# Patient Record
Sex: Female | Born: 1955 | Race: White | Hispanic: No | Marital: Married | Smoking: Former smoker
Health system: Southern US, Community
[De-identification: ages and names within clinical notes are randomized; demographics above are authoritative.]

## PROBLEM LIST (undated history)

## (undated) DIAGNOSIS — C801 Malignant (primary) neoplasm, unspecified: Secondary | ICD-10-CM

## (undated) DIAGNOSIS — F329 Major depressive disorder, single episode, unspecified: Secondary | ICD-10-CM

## (undated) DIAGNOSIS — J45909 Unspecified asthma, uncomplicated: Secondary | ICD-10-CM

## (undated) DIAGNOSIS — T7840XA Allergy, unspecified, initial encounter: Secondary | ICD-10-CM

## (undated) DIAGNOSIS — E785 Hyperlipidemia, unspecified: Secondary | ICD-10-CM

## (undated) DIAGNOSIS — I10 Essential (primary) hypertension: Secondary | ICD-10-CM

## (undated) DIAGNOSIS — F32A Depression, unspecified: Secondary | ICD-10-CM

## (undated) DIAGNOSIS — M1990 Unspecified osteoarthritis, unspecified site: Secondary | ICD-10-CM

## (undated) DIAGNOSIS — E079 Disorder of thyroid, unspecified: Secondary | ICD-10-CM

## (undated) HISTORY — PX: BRAIN SURGERY: SHX531

## (undated) HISTORY — DX: Unspecified asthma, uncomplicated: J45.909

## (undated) HISTORY — DX: Depression, unspecified: F32.A

## (undated) HISTORY — DX: Hyperlipidemia, unspecified: E78.5

## (undated) HISTORY — DX: Allergy, unspecified, initial encounter: T78.40XA

## (undated) HISTORY — DX: Disorder of thyroid, unspecified: E07.9

## (undated) HISTORY — DX: Essential (primary) hypertension: I10

## (undated) HISTORY — DX: Unspecified osteoarthritis, unspecified site: M19.90

## (undated) HISTORY — DX: Malignant (primary) neoplasm, unspecified: C80.1

---
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
## 1981-06-21 HISTORY — PX: EXPLORATORY LAPAROTOMY: SUR591

## 1996-06-21 HISTORY — PX: CHOLECYSTECTOMY: SHX55

## 1999-06-02 ENCOUNTER — Other Ambulatory Visit: Admission: RE | Admit: 1999-06-02 | Discharge: 1999-06-02 | Payer: Self-pay | Admitting: Family Medicine

## 1999-08-27 ENCOUNTER — Ambulatory Visit (HOSPITAL_COMMUNITY): Admission: RE | Admit: 1999-08-27 | Discharge: 1999-08-27 | Payer: Self-pay | Admitting: Family Medicine

## 1999-08-27 ENCOUNTER — Encounter: Payer: Self-pay | Admitting: Family Medicine

## 2001-03-09 ENCOUNTER — Other Ambulatory Visit: Admission: RE | Admit: 2001-03-09 | Discharge: 2001-03-09 | Payer: Self-pay | Admitting: Family Medicine

## 2004-01-28 ENCOUNTER — Ambulatory Visit (HOSPITAL_COMMUNITY): Admission: RE | Admit: 2004-01-28 | Discharge: 2004-01-28 | Payer: Self-pay | Admitting: Family Medicine

## 2004-02-11 ENCOUNTER — Encounter: Admission: RE | Admit: 2004-02-11 | Discharge: 2004-02-11 | Payer: Self-pay | Admitting: Family Medicine

## 2005-03-04 ENCOUNTER — Encounter: Admission: RE | Admit: 2005-03-04 | Discharge: 2005-03-04 | Payer: Self-pay | Admitting: Family Medicine

## 2008-01-04 ENCOUNTER — Encounter: Admission: RE | Admit: 2008-01-04 | Discharge: 2008-01-04 | Payer: Self-pay | Admitting: Family Medicine

## 2008-11-12 ENCOUNTER — Other Ambulatory Visit: Admission: RE | Admit: 2008-11-12 | Discharge: 2008-11-12 | Payer: Self-pay | Admitting: Family Medicine

## 2008-11-12 ENCOUNTER — Ambulatory Visit: Payer: Self-pay | Admitting: Family Medicine

## 2008-11-12 ENCOUNTER — Encounter: Payer: Self-pay | Admitting: Family Medicine

## 2008-11-12 DIAGNOSIS — F4321 Adjustment disorder with depressed mood: Secondary | ICD-10-CM

## 2008-11-12 DIAGNOSIS — E039 Hypothyroidism, unspecified: Secondary | ICD-10-CM | POA: Insufficient documentation

## 2008-11-12 DIAGNOSIS — E785 Hyperlipidemia, unspecified: Secondary | ICD-10-CM

## 2008-11-14 ENCOUNTER — Ambulatory Visit: Payer: Self-pay | Admitting: Family Medicine

## 2008-11-14 LAB — CONVERTED CEMR LAB
Alkaline Phosphatase: 85 units/L (ref 39–117)
BUN: 16 mg/dL (ref 6–23)
Basophils Absolute: 0 10*3/uL (ref 0.0–0.1)
Bilirubin, Direct: 0 mg/dL (ref 0.0–0.3)
CO2: 26 meq/L (ref 19–32)
Calcium: 9.1 mg/dL (ref 8.4–10.5)
Cholesterol: 247 mg/dL — ABNORMAL HIGH (ref 0–200)
Creatinine, Ser: 0.7 mg/dL (ref 0.4–1.2)
Eosinophils Absolute: 0.1 10*3/uL (ref 0.0–0.7)
Glucose, Bld: 95 mg/dL (ref 70–99)
Hgb A1c MFr Bld: 6.1 % (ref 4.6–6.5)
Lymphocytes Relative: 41.8 % (ref 12.0–46.0)
MCHC: 34 g/dL (ref 30.0–36.0)
MCV: 83.8 fL (ref 78.0–100.0)
Monocytes Absolute: 0.3 10*3/uL (ref 0.1–1.0)
Neutrophils Relative %: 51.5 % (ref 43.0–77.0)
Platelets: 265 10*3/uL (ref 150.0–400.0)
RBC: 4.76 M/uL (ref 3.87–5.11)
RDW: 13.8 % (ref 11.5–14.6)
Total Bilirubin: 0.7 mg/dL (ref 0.3–1.2)
Total CHOL/HDL Ratio: 5
Triglycerides: 203 mg/dL — ABNORMAL HIGH (ref 0.0–149.0)

## 2008-11-19 ENCOUNTER — Ambulatory Visit: Payer: Self-pay | Admitting: Gastroenterology

## 2008-11-28 ENCOUNTER — Ambulatory Visit: Payer: Self-pay | Admitting: Gastroenterology

## 2008-11-28 ENCOUNTER — Encounter: Payer: Self-pay | Admitting: Gastroenterology

## 2008-11-29 ENCOUNTER — Encounter: Payer: Self-pay | Admitting: Gastroenterology

## 2009-01-09 ENCOUNTER — Encounter: Admission: RE | Admit: 2009-01-09 | Discharge: 2009-01-09 | Payer: Self-pay | Admitting: Family Medicine

## 2010-07-12 ENCOUNTER — Encounter: Payer: Self-pay | Admitting: Family Medicine

## 2010-11-10 ENCOUNTER — Other Ambulatory Visit: Payer: Self-pay | Admitting: Family Medicine

## 2011-10-06 ENCOUNTER — Other Ambulatory Visit: Payer: Self-pay | Admitting: Family Medicine

## 2011-10-07 ENCOUNTER — Other Ambulatory Visit: Payer: Self-pay | Admitting: Family Medicine

## 2011-11-05 ENCOUNTER — Other Ambulatory Visit: Payer: Self-pay | Admitting: Family

## 2011-11-05 MED ORDER — FLUOXETINE HCL 20 MG PO CAPS: 20.0000 mg | ORAL_CAPSULE | Freq: Every day | ORAL | Status: DC

## 2011-11-05 MED ORDER — LEVOTHYROXINE SODIUM 88 MCG PO TABS: 88.0000 ug | ORAL_TABLET | Freq: Every day | ORAL | Status: DC

## 2011-11-05 NOTE — Telephone Encounter (Signed)
Pt needs refill on synthroid 88 mcg and fluoxetine 20 mg call into [facility]

## 2011-11-09 ENCOUNTER — Other Ambulatory Visit (INDEPENDENT_AMBULATORY_CARE_PROVIDER_SITE_OTHER): Payer: 59

## 2011-11-09 DIAGNOSIS — Z0000 Encounter for general adult medical examination without abnormal findings: Secondary | ICD-10-CM

## 2011-11-09 LAB — HEPATIC FUNCTION PANEL
ALT: 20 U/L (ref 0–35)
AST: 20 U/L (ref 0–37)
Albumin: 4 g/dL (ref 3.5–5.2)
Total Bilirubin: 0.3 mg/dL (ref 0.3–1.2)

## 2011-11-09 LAB — LDL CHOLESTEROL, DIRECT: Direct LDL: 140.9 mg/dL

## 2011-11-09 LAB — POCT URINALYSIS DIPSTICK
Leukocytes, UA: NEGATIVE
Nitrite, UA: NEGATIVE
Protein, UA: NEGATIVE
Urobilinogen, UA: 0.2
pH, UA: 5.5

## 2011-11-09 LAB — CBC WITH DIFFERENTIAL/PLATELET
Basophils Absolute: 0 10*3/uL (ref 0.0–0.1)
Eosinophils Relative: 1.3 % (ref 0.0–5.0)
HCT: 39.3 % (ref 36.0–46.0)
Hemoglobin: 12.9 g/dL (ref 12.0–15.0)
Lymphs Abs: 3.1 10*3/uL (ref 0.7–4.0)
MCV: 83.5 fl (ref 78.0–100.0)
Monocytes Absolute: 0.3 10*3/uL (ref 0.1–1.0)
Monocytes Relative: 3.6 % (ref 3.0–12.0)
Neutro Abs: 3.7 10*3/uL (ref 1.4–7.7)
Platelets: 263 10*3/uL (ref 150.0–400.0)
RDW: 15.8 % — ABNORMAL HIGH (ref 11.5–14.6)

## 2011-11-09 LAB — BASIC METABOLIC PANEL
BUN: 17 mg/dL (ref 6–23)
Chloride: 112 mEq/L (ref 96–112)
GFR: 101.72 mL/min (ref >60.00)
Glucose, Bld: 95 mg/dL (ref 70–99)
Potassium: 4.1 mEq/L (ref 3.5–5.1)
Sodium: 144 mEq/L (ref 135–145)

## 2011-11-09 LAB — TSH: TSH: 4.77 u[IU]/mL (ref 0.35–5.50)

## 2011-11-17 ENCOUNTER — Ambulatory Visit: Payer: Self-pay | Admitting: Family

## 2011-11-23 ENCOUNTER — Other Ambulatory Visit (HOSPITAL_COMMUNITY)
Admission: RE | Admit: 2011-11-23 | Discharge: 2011-11-23 | Disposition: A | Discharge status: Home / Self Care | Payer: 59 | Source: Ambulatory Visit | Attending: Family Medicine | Admitting: Family Medicine

## 2011-11-23 ENCOUNTER — Ambulatory Visit (INDEPENDENT_AMBULATORY_CARE_PROVIDER_SITE_OTHER): Payer: 59 | Admitting: Family Medicine

## 2011-11-23 ENCOUNTER — Encounter: Payer: Self-pay | Admitting: Family Medicine

## 2011-11-23 VITALS — BP 134/98 | Temp 97.9°F | Ht 62.5 in | Wt 192.0 lb

## 2011-11-23 DIAGNOSIS — E785 Hyperlipidemia, unspecified: Secondary | ICD-10-CM

## 2011-11-23 DIAGNOSIS — F329 Major depressive disorder, single episode, unspecified: Secondary | ICD-10-CM

## 2011-11-23 DIAGNOSIS — Z0000 Encounter for general adult medical examination without abnormal findings: Secondary | ICD-10-CM

## 2011-11-23 DIAGNOSIS — Z01419 Encounter for gynecological examination (general) (routine) without abnormal findings: Secondary | ICD-10-CM | POA: Insufficient documentation

## 2011-11-23 DIAGNOSIS — E039 Hypothyroidism, unspecified: Secondary | ICD-10-CM

## 2011-11-23 DIAGNOSIS — E663 Overweight: Secondary | ICD-10-CM

## 2011-11-23 DIAGNOSIS — N760 Acute vaginitis: Secondary | ICD-10-CM | POA: Insufficient documentation

## 2011-11-23 MED ORDER — ESTRADIOL 0.1 MG/GM VA CREA: 2.0000 g | TOPICAL_CREAM | Freq: Every day | VAGINAL | Status: AC

## 2011-11-23 MED ORDER — FLUOXETINE HCL 20 MG PO CAPS: 20.0000 mg | ORAL_CAPSULE | Freq: Every day | ORAL | Status: DC

## 2011-11-23 MED ORDER — LEVOTHYROXINE SODIUM 88 MCG PO TABS: 88.0000 ug | ORAL_TABLET | Freq: Every day | ORAL | Status: DC

## 2011-11-23 NOTE — Patient Instructions (Signed)
Use small amounts of the hormonal cream twice weekly  Get over the counter antifungal cream,,,,,,,,, apply small amounts twice daily until the rash clears  Continue your diet and exercise program  Call today to get set up for mammogram  Return in one year sooner if any problems

## 2011-11-23 NOTE — Progress Notes (Signed)
  Subjective:    Patient ID: Heidi Christensen, female    DOB: January 25, 1956, 56 y.o.   MRN: [id number]  HPI Imya is a 56 year old female nonsmoker who comes in today for general physical examination  She has a history of hypothyroidism for which she takes Synthroid 88 mcg daily TSH level normal  She has a history of mild depression for which she takes Prozac 20 mg each bedtime  She continues to struggle with her weight is 192 pounds although she has started an exercise program and says she's lost 10 pounds  She's had trouble postmenopausal vaginal dryness  Her lipids are borderline negative family history of any early coronary disease   Review of Systems  Constitutional: Negative.   HENT: Negative.   Eyes: Negative.   Respiratory: Negative.   Cardiovascular: Negative.   Gastrointestinal: Negative.   Genitourinary: Negative.   Musculoskeletal: Negative.   Neurological: Negative.   Hematological: Negative.   Psychiatric/Behavioral: Negative.        Objective:   Physical Exam  Constitutional: She appears well-developed and well-nourished.  HENT:  Head: Normocephalic and atraumatic.  Right Ear: External ear normal.  Left Ear: External ear normal.  Nose: Nose normal.  Mouth/Throat: Oropharynx is clear and moist.  Eyes: EOM are normal. Pupils are equal, round, and reactive to light.  Neck: Normal range of motion. Neck supple. No thyromegaly present.  Cardiovascular: Normal rate, regular rhythm, normal heart sounds and intact distal pulses.  Exam reveals no gallop and no friction rub.   No murmur heard. Pulmonary/Chest: Effort normal and breath sounds normal.  Abdominal: Soft. Bowel sounds are normal. She exhibits no distension and no mass. There is no tenderness. There is no rebound.  Genitourinary: Vagina normal and uterus normal. Guaiac negative stool. No vaginal discharge found.  Musculoskeletal: Normal range of motion.  Lymphadenopathy:    She has no cervical  adenopathy.  Neurological: She is alert. She has normal reflexes. No cranial nerve deficit. She exhibits normal muscle tone. Coordination normal.  Skin: Skin is warm and dry.       Total body skin exam normal except for a fungal infection in the groin area also extremely dry and red labia  Psychiatric: She has a normal mood and affect. Her behavior is normal. Judgment and thought content normal.          Assessment & Plan:  Healthy female  History of mild depression continue Prozac 20 mg each bedtime  Hyperlipidemia borderline diet and exercise and weight loss  Hypothyroidism continue Synthroid 80 mcg daily  Overweight continue diet exercise  Postmenopausal vaginal dryness Premarin vaginal cream twice weekly

## 2011-11-25 ENCOUNTER — Other Ambulatory Visit: Payer: Self-pay | Admitting: Family Medicine

## 2011-11-25 DIAGNOSIS — Z1231 Encounter for screening mammogram for malignant neoplasm of breast: Secondary | ICD-10-CM

## 2011-12-01 ENCOUNTER — Ambulatory Visit
Admission: RE | Admit: 2011-12-01 | Discharge: 2011-12-01 | Disposition: A | Discharge status: Home / Self Care | Payer: 59 | Source: Ambulatory Visit | Attending: Family Medicine | Admitting: Family Medicine

## 2011-12-01 DIAGNOSIS — Z1231 Encounter for screening mammogram for malignant neoplasm of breast: Secondary | ICD-10-CM

## 2012-01-20 ENCOUNTER — Ambulatory Visit (INDEPENDENT_AMBULATORY_CARE_PROVIDER_SITE_OTHER): Payer: 59 | Admitting: Family Medicine

## 2012-01-20 ENCOUNTER — Encounter: Payer: Self-pay | Admitting: Family Medicine

## 2012-01-20 VITALS — BP 130/98 | Temp 98.8°F | Wt 192.0 lb

## 2012-01-20 DIAGNOSIS — I10 Essential (primary) hypertension: Secondary | ICD-10-CM

## 2012-01-20 MED ORDER — LISINOPRIL-HYDROCHLOROTHIAZIDE 10-12.5 MG PO TABS: ORAL_TABLET | ORAL | Status: DC

## 2012-01-20 NOTE — Patient Instructions (Addendum)
Begin D. Zestoretic one half tablet daily  Check your blood pressure daily at home  Return to fourth week in August for followup

## 2012-01-20 NOTE — Progress Notes (Signed)
  Subjective:    Patient ID: Heidi Christensen, female    DOB: 1955-12-02, 56 y.o.   MRN: [id number]  HPI Heidi Christensen is a 56 year old female who comes in today for evaluation of elevated blood pressure  Over the past couple weeks her blood pressure has gone up. It's in the 150- systolic up to 105 diastolic.  Her father had hypertension   Review of Systems General and cardiovascular review of systems otherwise negative    Objective:   Physical Exam Well-developed well-nourished female no acute distress BP right arm sitting position 150/100       Assessment & Plan:

## 2012-02-16 ENCOUNTER — Other Ambulatory Visit: Payer: Self-pay | Admitting: *Deleted

## 2012-02-16 ENCOUNTER — Encounter: Payer: Self-pay | Admitting: Family Medicine

## 2012-02-16 ENCOUNTER — Ambulatory Visit (INDEPENDENT_AMBULATORY_CARE_PROVIDER_SITE_OTHER): Payer: 59 | Admitting: Family Medicine

## 2012-02-16 VITALS — BP 140/100 | Temp 98.3°F | Wt 189.0 lb

## 2012-02-16 DIAGNOSIS — I10 Essential (primary) hypertension: Secondary | ICD-10-CM

## 2012-02-16 MED ORDER — LISINOPRIL-HYDROCHLOROTHIAZIDE 10-12.5 MG PO TABS: 1.0000 | ORAL_TABLET | Freq: Every day | ORAL | Status: DC

## 2012-02-16 NOTE — Patient Instructions (Signed)
Increase the lisinopril to one tablet daily  Check her morning blood pressure for 4 weeks if at the end of that time your blood pressure is normal.........Marland Kitchen 130/85 or less......... then continue that dose if not call and we will increase her medication

## 2012-02-16 NOTE — Progress Notes (Signed)
  Subjective:    Patient ID: Heidi Christensen, female    DOB: 10-29-1955, 56 y.o.   MRN: [id number]  HPI Heidi Christensen is a 56 year old female married nonsmoker who comes in today for followup of hypertension she's currently on 5 mg of lisinopril daily BP 140/100. No side effects to medication   Review of Systems    general and cardiovascular review of systems otherwise negative Objective:   Physical Exam Well-developed well-nourished female in no acute distress BP right arm sitting position 140/94 pulse 70 and regular       Assessment & Plan:  Hypertension not at goal increase lisinopril 10 mg daily followup in 4 weeks if blood pressure not normal

## 2012-02-17 ENCOUNTER — Ambulatory Visit: Payer: 59 | Admitting: Family Medicine

## 2012-03-14 ENCOUNTER — Telehealth: Payer: Self-pay | Admitting: Family Medicine

## 2012-03-14 NOTE — Telephone Encounter (Signed)
Pt called and said that her bp is increasing again. Med needs to be adjusted. Pls call pt at work. If pt does not answer work number, pls be sure to hold until pt can come to phone. [facility]. Pt was given a 90 day supply of lisinopril-hydrochlorothiazide (PRINZIDE,ZESTORETIC) 10-12.5 MG per.  Need stronger dose.

## 2012-03-15 NOTE — Telephone Encounter (Signed)
Left message on machine for patient to call back and schedule an appointment. 

## 2012-03-15 NOTE — Telephone Encounter (Signed)
Heidi Christensen please call she'll need to make an appointment either tomorrow or next week. Bring all her blood pressure readings in the device she is using and her medication

## 2012-04-12 ENCOUNTER — Other Ambulatory Visit: Payer: Self-pay | Admitting: Family Medicine

## 2012-05-30 ENCOUNTER — Encounter: Payer: Self-pay | Admitting: Family Medicine

## 2012-05-30 ENCOUNTER — Ambulatory Visit (INDEPENDENT_AMBULATORY_CARE_PROVIDER_SITE_OTHER): Payer: 59 | Admitting: Family Medicine

## 2012-05-30 VITALS — BP 120/84 | Temp 98.1°F | Wt 185.0 lb

## 2012-05-30 DIAGNOSIS — I10 Essential (primary) hypertension: Secondary | ICD-10-CM

## 2012-05-30 DIAGNOSIS — K625 Hemorrhage of anus and rectum: Secondary | ICD-10-CM

## 2012-05-30 MED ORDER — HYDROCORTISONE ACETATE 25 MG RE SUPP: RECTAL | Status: DC

## 2012-05-30 NOTE — Progress Notes (Signed)
  Subjective:    Patient ID: Heidi Christensen, female    DOB: Nov 03, 1955, 56 y.o.   MRN: [id number]  HPI Chaneka is a 56 year old female nonsmoker who comes in today for evaluation of 2 problems  She states yesterday about noontime she had the urgency after lunch to have a bowel movement. When she went there was bright red rectal bleeding. Since that time she has had 5 loose bowel movements all of which were normal and no more bleeding. No pain. She had a colonoscopy about 5 years ago was normal. Bowel habits previously in the last year to have been unchanged.  She takes lisinopril 10-12.5 daily for hypertension and she's having episodes where she stands up almost daily and gets lightheaded.   Review of Systems    general GI cardiovascular review of systems otherwise negative Objective:   Physical Exam Well-developed well-nourished female no acute distress examination of the rectum appears normal. Digital rectal examination shows a normal rectal vault no palpable masses stool guaiac negative  BP right arm sitting position 120/84     Assessment & Plan:  Bright red rectal bleeding probably secondary to an internal hemorrhoid plan stool softeners Anusol-HC each bedtime for 12 nights. If bleeding recurs GI evaluation  Light headedness decrease lisinopril at half monitor blood pressure to be sure it stays at goal return when necessary

## 2012-05-30 NOTE — Patient Instructions (Signed)
Take a stool softener on a daily basis  Anusol-HC suppositories,,,,,,,,,, 1 nightly for 12 nights  If the bleeding recurs contact your GI person for further evaluation  Decrease  your blood pressure medicine by one half  Check your blood pressure daily for 4 weeks to be sure it stays normal

## 2012-09-10 ENCOUNTER — Ambulatory Visit (INDEPENDENT_AMBULATORY_CARE_PROVIDER_SITE_OTHER): Payer: 59 | Admitting: Family Medicine

## 2012-09-10 ENCOUNTER — Ambulatory Visit: Payer: 59

## 2012-09-10 VITALS — BP 124/84 | HR 94 | Temp 99.6°F | Resp 18 | Ht 62.9 in | Wt 180.0 lb

## 2012-09-10 DIAGNOSIS — R059 Cough, unspecified: Secondary | ICD-10-CM

## 2012-09-10 DIAGNOSIS — J45901 Unspecified asthma with (acute) exacerbation: Secondary | ICD-10-CM

## 2012-09-10 DIAGNOSIS — R05 Cough: Secondary | ICD-10-CM

## 2012-09-10 DIAGNOSIS — R062 Wheezing: Secondary | ICD-10-CM

## 2012-09-10 DIAGNOSIS — J189 Pneumonia, unspecified organism: Secondary | ICD-10-CM

## 2012-09-10 LAB — POCT CBC
Granulocyte percent: 65.9 %G (ref 37–80)
HCT, POC: 42.2 % (ref 37.7–47.9)
Hemoglobin: 13.3 g/dL (ref 12.2–16.2)
Lymph, poc: 1.3 (ref 0.6–3.4)
MCH, POC: 26.7 pg — AB (ref 27–31.2)
MCHC: 31.5 g/dL — AB (ref 31.8–35.4)
MCV: 84.8 fL (ref 80–97)
MID (cbc): 0.4 (ref 0–0.9)
MPV: 8.5 fL (ref 0–99.8)
POC Granulocyte: 3.2 (ref 2–6.9)
POC LYMPH PERCENT: 26.6 %L (ref 10–50)
POC MID %: 7.5 %M (ref 0–12)
Platelet Count, POC: 290 10*3/uL (ref 142–424)
RBC: 4.98 M/uL (ref 4.04–5.48)
RDW, POC: 16.1 %
WBC: 4.9 10*3/uL (ref 4.6–10.2)

## 2012-09-10 MED ORDER — ALBUTEROL SULFATE (2.5 MG/3ML) 0.083% IN NEBU: 2.5000 mg | INHALATION_SOLUTION | Freq: Once | RESPIRATORY_TRACT | Status: DC

## 2012-09-10 MED ORDER — BECLOMETHASONE DIPROPIONATE 40 MCG/ACT IN AERS: 2.0000 | INHALATION_SPRAY | Freq: Two times a day (BID) | RESPIRATORY_TRACT | Status: DC

## 2012-09-10 MED ORDER — ALBUTEROL SULFATE HFA 108 (90 BASE) MCG/ACT IN AERS: 2.0000 | INHALATION_SPRAY | RESPIRATORY_TRACT | Status: DC | PRN

## 2012-09-10 MED ORDER — HYDROCODONE-HOMATROPINE 5-1.5 MG/5ML PO SYRP: 5.0000 mL | ORAL_SOLUTION | Freq: Three times a day (TID) | ORAL | Status: DC | PRN

## 2012-09-10 MED ORDER — CEFDINIR 300 MG PO CAPS: 300.0000 mg | ORAL_CAPSULE | Freq: Two times a day (BID) | ORAL | Status: DC

## 2012-09-10 NOTE — Patient Instructions (Addendum)

## 2012-09-10 NOTE — Progress Notes (Signed)
Subjective:    Patient ID: Heidi Christensen, female    DOB: [birth year], 57 y.o.   MRN: [id number] Chief Complaint  Patient presents with  . Cough    pt had cold 2wks ago - awoke x1 day with fever, weakness-pt ? bronchitis  . Fatigue   HPI  Bad cough and trouble breathing - worse when laying down.  Chills and fever - she never runs a fever so has been low grade.  Her husband saw Dr. Cleta Alberts yesterday and he was diagnosed with bronchitis - was put on cefidinir and azelastin nasal spray.  No other underlying lung diseases.  Was ill a couple wks ago with sneezing coughing, ears stopped up, no fever, stayed home from work for 2d but felt fine but cough was still occasionally but then cough continued to get worse and she developed other sxs yest.  Cough is occasionally productive of clear mucous.  Cough is keeping both her and her husband awake.  Appetite is down but she is tolerating po. No other GI/GU sxs.  A little wheezing with a history of allergic asthma - so has an asthma attack about once a year.  She is having chest pain - right and central - with coughing - has to brace her chest.  Felt a little dizzy this morning.  Her husband is just getting over histoplasmosis - just came off his antifungal.  Past Medical History  Diagnosis Date  . Arthritis   . Asthma   . Cancer   . Thyroid disease    Current Outpatient Prescriptions on File Prior to Visit  Medication Sig Dispense Refill  . FLUoxetine (PROZAC) 20 MG capsule TAKE 1 CAPSULE BY MOUTH ONCE DAILY  90 capsule  1  . levothyroxine (SYNTHROID) 88 MCG tablet Take 1 tablet (88 mcg total) by mouth daily.  30 tablet  3  . lisinopril-hydrochlorothiazide (PRINZIDE,ZESTORETIC) 10-12.5 MG per tablet Take 1 tablet by mouth daily.  90 tablet  3  . Olopatadine HCl (PATADAY) 0.2 % SOLN Apply to eye.      . estradiol (ESTRACE VAGINAL) 0.1 MG/GM vaginal cream Place 0.25 Applicatorfuls vaginally daily.  42.5 g  12  . hydrocortisone (ANUSOL-HC) 25 MG  suppository 1 suppository each bedtime x12 nights  12 suppository  0   No current facility-administered medications on file prior to visit.   No Known Allergies  Review of Systems  Constitutional: Positive for fever, chills, diaphoresis, activity change, appetite change and fatigue. Negative for unexpected weight change.  HENT: Positive for congestion and sneezing. Negative for sore throat, rhinorrhea and postnasal drip.   Respiratory: Positive for cough, chest tightness, shortness of breath and wheezing. Negative for stridor.   Cardiovascular: Positive for chest pain.  Gastrointestinal: Negative for nausea, vomiting, abdominal pain, diarrhea and constipation.  Genitourinary: Negative for difficulty urinating.  Skin: Negative for rash.  Neurological: Positive for dizziness, weakness and light-headedness.  Hematological: Negative for adenopathy.  Psychiatric/Behavioral: Positive for sleep disturbance.       BP 124/84  Pulse 94  Temp(Src) 99.6 F (37.6 C) (Oral)  Resp 18  Ht 5' 2.9" (1.598 m)  Wt 180 lb (81.647 kg)  BMI 31.97 kg/m2  SpO2 96% Objective:   Physical Exam  Constitutional: She is oriented to person, place, and time. She appears well-developed and well-nourished. She appears lethargic. She appears ill. No distress.  HENT:  Head: Normocephalic and atraumatic.  Right Ear: Tympanic membrane, external ear and ear canal normal.  Left Ear: Tympanic membrane, external  ear and ear canal normal.  Nose: Rhinorrhea present. No mucosal edema. Right sinus exhibits no maxillary sinus tenderness. Left sinus exhibits no maxillary sinus tenderness.  Mouth/Throat: Uvula is midline and mucous membranes are normal. Posterior oropharyngeal erythema present. No oropharyngeal exudate or posterior oropharyngeal edema.  Eyes: Conjunctivae are normal. Right eye exhibits no discharge. Left eye exhibits no discharge. No scleral icterus.  Neck: Normal range of motion. Neck supple.  Cardiovascular:  Normal rate, regular rhythm, normal heart sounds and intact distal pulses.   Pulmonary/Chest: Effort normal. Not tachypneic. No respiratory distress. She has no decreased breath sounds. She has wheezes in the right upper field, the right lower field and the left lower field. She has no rhonchi. She has no rales.  Exp wheezing more pronounced on the right  Lymphadenopathy:    She has no cervical adenopathy.  Neurological: She is oriented to person, place, and time. She appears lethargic.  Skin: Skin is warm and dry. She is not diaphoretic. No erythema.  Psychiatric: She has a normal mood and affect. Her behavior is normal.       Results for orders placed in visit on 09/10/12  POCT CBC      Result Value Range   WBC 4.9  4.6 - 10.2 K/uL   Lymph, poc 1.3  0.6 - 3.4   POC LYMPH PERCENT 26.6  10 - 50 %L   MID (cbc) 0.4  0 - 0.9   POC MID % 7.5  0 - 12 %M   POC Granulocyte 3.2  2 - 6.9   Granulocyte percent 65.9  37 - 80 %G   RBC 4.98  4.04 - 5.48 M/uL   Hemoglobin 13.3  12.2 - 16.2 g/dL   HCT, POC 16.1  09.6 - 47.9 %   MCV 84.8  80 - 97 fL   MCH, POC 26.7 (*) 27 - 31.2 pg   MCHC 31.5 (*) 31.8 - 35.4 g/dL   RDW, POC 04.5     Platelet Count, POC 290  142 - 424 K/uL   MPV 8.5  0 - 99.8 fL   [facility] reading (PRIMARY) by  Dr. Clelia Croft. Right lower lobe hilar consolidation  Assessment & Plan:  Wheezing - Plan: POCT CBC, DG Chest 2 View, albuterol (PROVENTIL) (2.5 MG/3ML) 0.083% nebulizer solution 2.5 mg - resolved after alb neb treatment in office.  Cough - Plan: POCT CBC, DG Chest 2 View, albuterol (PROVENTIL) (2.5 MG/3ML) 0.083% nebulizer solution 2.5 mg  Pneumonia - start omnicef   Unspecified asthma, with exacerbation - start qvar with prn albuterol during acute illness only - not a long term med.  Meds ordered this encounter  Medications  . albuterol (PROVENTIL) (2.5 MG/3ML) 0.083% nebulizer solution 2.5 mg    Sig:   . albuterol (PROVENTIL HFA;VENTOLIN HFA) 108 (90 BASE) MCG/ACT  inhaler    Sig: Inhale 2 puffs into the lungs every 4 (four) hours as needed for wheezing (cough, shortness of breath or wheezing.).    Dispense:  1 Inhaler    Refill:  1  . beclomethasone (QVAR) 40 MCG/ACT inhaler    Sig: Inhale 2 puffs into the lungs 2 (two) times daily.    Dispense:  1 Inhaler    Refill:  0  . HYDROcodone-homatropine (HYCODAN) 5-1.5 MG/5ML syrup    Sig: Take 5 mLs by mouth every 8 (eight) hours as needed for cough.    Dispense:  120 mL    Refill:  0  . cefdinir (OMNICEF) 300  MG capsule    Sig: Take 1 capsule (300 mg total) by mouth 2 (two) times daily.    Dispense:  20 capsule    Refill:  0

## 2012-10-30 ENCOUNTER — Other Ambulatory Visit: Payer: Self-pay | Admitting: Family Medicine

## 2012-11-03 ENCOUNTER — Other Ambulatory Visit: Payer: Self-pay | Admitting: *Deleted

## 2012-11-03 DIAGNOSIS — E039 Hypothyroidism, unspecified: Secondary | ICD-10-CM

## 2012-11-03 MED ORDER — LEVOTHYROXINE SODIUM 88 MCG PO TABS: 88.0000 ug | ORAL_TABLET | Freq: Every day | ORAL | Status: DC

## 2013-02-01 ENCOUNTER — Other Ambulatory Visit: Payer: Self-pay | Admitting: Family Medicine

## 2013-04-02 ENCOUNTER — Telehealth: Payer: Self-pay | Admitting: Family Medicine

## 2013-04-02 NOTE — Telephone Encounter (Signed)
Okay to work in with me

## 2013-04-02 NOTE — Telephone Encounter (Signed)
Pt requesting to be worked in for CPX sometime early December due to wellness plan deadline.  Pt states she is ok with seeing another provider if Dr. Tawanna Cooler is unable to work her in.

## 2013-04-02 NOTE — Telephone Encounter (Signed)
Please schedule patient for physical.

## 2013-04-04 NOTE — Telephone Encounter (Signed)
Pt is sch for 05-29-13

## 2013-05-07 ENCOUNTER — Other Ambulatory Visit: Payer: Self-pay | Admitting: Family Medicine

## 2013-05-14 ENCOUNTER — Other Ambulatory Visit: Payer: Self-pay | Admitting: Family Medicine

## 2013-05-29 ENCOUNTER — Other Ambulatory Visit (HOSPITAL_COMMUNITY)
Admission: RE | Admit: 2013-05-29 | Discharge: 2013-05-29 | Disposition: A | Discharge status: Home / Self Care | Payer: 59 | Source: Ambulatory Visit | Attending: Family Medicine | Admitting: Family Medicine

## 2013-05-29 ENCOUNTER — Ambulatory Visit (INDEPENDENT_AMBULATORY_CARE_PROVIDER_SITE_OTHER): Payer: 59 | Admitting: Family Medicine

## 2013-05-29 ENCOUNTER — Encounter: Payer: Self-pay | Admitting: Family Medicine

## 2013-05-29 VITALS — BP 130/90 | Temp 98.4°F | Ht 63.25 in | Wt 182.0 lb

## 2013-05-29 DIAGNOSIS — Z01419 Encounter for gynecological examination (general) (routine) without abnormal findings: Secondary | ICD-10-CM | POA: Insufficient documentation

## 2013-05-29 DIAGNOSIS — I10 Essential (primary) hypertension: Secondary | ICD-10-CM

## 2013-05-29 DIAGNOSIS — E663 Overweight: Secondary | ICD-10-CM

## 2013-05-29 DIAGNOSIS — E039 Hypothyroidism, unspecified: Secondary | ICD-10-CM

## 2013-05-29 DIAGNOSIS — F329 Major depressive disorder, single episode, unspecified: Secondary | ICD-10-CM

## 2013-05-29 LAB — POCT URINALYSIS DIPSTICK
Bilirubin, UA: NEGATIVE
Ketones, UA: NEGATIVE
Leukocytes, UA: NEGATIVE
Protein, UA: NEGATIVE
Spec Grav, UA: >=1.03

## 2013-05-29 LAB — HEPATIC FUNCTION PANEL
ALT: 20 U/L (ref 0–35)
AST: 16 U/L (ref 0–37)
Bilirubin, Direct: 0 mg/dL (ref 0.0–0.3)
Total Bilirubin: 0.5 mg/dL (ref 0.3–1.2)

## 2013-05-29 LAB — LIPID PANEL
HDL: 47 mg/dL (ref >39.00)
Total CHOL/HDL Ratio: 6

## 2013-05-29 LAB — BASIC METABOLIC PANEL
CO2: 25 mEq/L (ref 19–32)
Chloride: 104 mEq/L (ref 96–112)
Creatinine, Ser: 0.7 mg/dL (ref 0.4–1.2)
Glucose, Bld: 94 mg/dL (ref 70–99)
Potassium: 3.9 mEq/L (ref 3.5–5.1)
Sodium: 137 mEq/L (ref 135–145)

## 2013-05-29 LAB — TSH: TSH: 3.86 u[IU]/mL (ref 0.35–5.50)

## 2013-05-29 MED ORDER — SYNTHROID 88 MCG PO TABS: ORAL_TABLET | ORAL | Status: DC

## 2013-05-29 MED ORDER — FLUOXETINE HCL 20 MG PO CAPS: ORAL_CAPSULE | ORAL | Status: DC

## 2013-05-29 MED ORDER — LISINOPRIL-HYDROCHLOROTHIAZIDE 10-12.5 MG PO TABS: ORAL_TABLET | ORAL | Status: DC

## 2013-05-29 NOTE — Patient Instructions (Signed)
Continue current medications  We will call you or me at her lab work back  Motrin 400 mg twice daily with food  Continue your walking program  You are due for a bone density  Call and get set up for mammogram  Return in one year sooner if any problems

## 2013-05-29 NOTE — Progress Notes (Signed)
   Subjective:    Patient ID: Heidi Christensen, female    DOB: [birth year], 57 y.o.   MRN: [id number]  HPI Heidi Christensen is a 57 year old female nonsmoker who comes in today for general physical examination  She takes Prozac 20 mg daily because of a history of mild depression  She takes Zestril he tenderness 4.5 daily for hypertension BP 130/90  She takes Synthroid 88 mcg because of a history of hypothyroidism  She takes albuterol and Qvar when necessary when she has a flare of her asthma which is very rare.  She gets routine eye care, dental care, colonoscopy in early 50s normal scar, vaccinations up-to-date  She says she was diagnosed about 10 years ago to have arthritis of her back. She was treated with Celebrex however was too expensive. She treats it with over-the-counter medication and exercise.  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 She took HRT for 3 years and stopped. Last year we gave her some hormonal cream but she didn't like it and therefore she stopped it.   Review of Systems  Constitutional: Negative.   HENT: Negative.   Eyes: Negative.   Respiratory: Negative.   Cardiovascular: Negative.   Gastrointestinal: Negative.   Endocrine: Negative.   Genitourinary: Negative.   Musculoskeletal: Negative.   Allergic/Immunologic: Negative.   Neurological: Negative.   Hematological: Negative.   Psychiatric/Behavioral: Negative.        Objective:   Physical Exam  Nursing note and vitals reviewed. Constitutional: She appears well-developed and well-nourished.  HENT:  Head: Normocephalic and atraumatic.  Right Ear: External ear normal.  Left Ear: External ear normal.  Nose: Nose normal.  Mouth/Throat: Oropharynx is clear and moist.  Eyes: EOM are normal. Pupils are equal, round, and reactive to light.  Neck: Normal range of motion. Neck supple. No thyromegaly present.  Cardiovascular: Normal rate, regular rhythm,  normal heart sounds and intact distal pulses.  Exam reveals no gallop and no friction rub.   No murmur heard. No carotid or bruits peripheral pulses 2+ and symmetrical  Pulmonary/Chest: Effort normal and breath sounds normal.  Abdominal: Soft. Bowel sounds are normal. She exhibits no distension and no mass. There is no tenderness. There is no rebound.  Genitourinary: Vagina normal and uterus normal. Guaiac negative stool. No vaginal discharge found.  Bilateral breast exam normal vagina very dry  Musculoskeletal: Normal range of motion.  Lymphadenopathy:    She has no cervical adenopathy.  Neurological: She is alert. She has normal reflexes. No cranial nerve deficit. She exhibits normal muscle tone. Coordination normal.  Skin: Skin is warm and dry.  Total body skin exam normal  Psychiatric: She has a normal mood and affect. Her behavior is normal. Judgment and thought content normal.          Assessment & Plan:  Healthy female  History of mild depression continue Prozac  Hypertension continue Zestoretic  Hypothyroidism continue Synthroid  Low back pain recommend Motrin 600 twice a day and exercise program.

## 2013-05-29 NOTE — Progress Notes (Signed)
Pre visit review using our clinic review tool, if applicable. No additional management support is needed unless otherwise documented below in the visit note. 

## 2013-06-22 ENCOUNTER — Other Ambulatory Visit: Payer: 59

## 2013-06-29 ENCOUNTER — Ambulatory Visit (INDEPENDENT_AMBULATORY_CARE_PROVIDER_SITE_OTHER)
Admission: RE | Admit: 2013-06-29 | Discharge: 2013-06-29 | Disposition: A | Discharge status: Home / Self Care | Payer: 59 | Source: Ambulatory Visit | Attending: Family Medicine | Admitting: Family Medicine

## 2013-06-29 DIAGNOSIS — Z01419 Encounter for gynecological examination (general) (routine) without abnormal findings: Secondary | ICD-10-CM

## 2013-06-29 DIAGNOSIS — E039 Hypothyroidism, unspecified: Secondary | ICD-10-CM

## 2013-06-29 DIAGNOSIS — F3289 Other specified depressive episodes: Secondary | ICD-10-CM

## 2013-06-29 DIAGNOSIS — I10 Essential (primary) hypertension: Secondary | ICD-10-CM

## 2013-06-29 DIAGNOSIS — F329 Major depressive disorder, single episode, unspecified: Secondary | ICD-10-CM

## 2013-06-29 DIAGNOSIS — E663 Overweight: Secondary | ICD-10-CM

## 2013-06-29 LAB — HM MAMMOGRAPHY

## 2013-10-16 ENCOUNTER — Ambulatory Visit (INDEPENDENT_AMBULATORY_CARE_PROVIDER_SITE_OTHER): Payer: 59 | Admitting: Family Medicine

## 2013-10-16 ENCOUNTER — Encounter: Payer: Self-pay | Admitting: Family Medicine

## 2013-10-16 VITALS — BP 120/90 | Temp 97.6°F | Wt 180.0 lb

## 2013-10-16 DIAGNOSIS — M5090 Cervical disc disorder, unspecified, unspecified cervical region: Secondary | ICD-10-CM

## 2013-10-16 MED ORDER — CYCLOBENZAPRINE HCL 10 MG PO TABS: 10.0000 mg | ORAL_TABLET | Freq: Three times a day (TID) | ORAL | Status: DC | PRN

## 2013-10-16 MED ORDER — TRAMADOL HCL 50 MG PO TABS: 50.0000 mg | ORAL_TABLET | Freq: Three times a day (TID) | ORAL | Status: DC | PRN

## 2013-10-16 NOTE — Progress Notes (Signed)
   Subjective:    Patient ID: Heidi Christensen, female    DOB: 1956/05/26, 58 y.o.   MRN: [id number]  HPI Heidi Christensen is a 58 year old female nonsmoker who works within the health system who comes in today for evaluation of neck pain  She states in December she had some shoulder pain went to a chiropractor had some manipulation over couple weeks that seem to resolve but then she started on pain and she points to the C4-C5 area of her cervical spine. For the last couple weeks the pains got worse. It can very from a 2-9 in terms of severity. It's constant and it feels sharp like a knife. It does not radiate. No history of trauma.   Review of Systems    review of systems otherwise negative Objective:   Physical Exam  Well-developed well-nourished female no acute distress vital signs stable she is afebrile examination of cervical spine shows no palpable tenderness.  Upper extremities,,,,,, sensation muscle strength reflexes all within normal limits,      Assessment & Plan:Cervical disc disease without neurologic deficit........ conservative therapy  cervical disc disease  .

## 2013-10-16 NOTE — Patient Instructions (Signed)
Flexeril and tramadol........... one half to one of each at bedtime for neck pain  If over the weekend and not working he could take one half to one of each 3 times daily and stay at bedrest for a day or 2  We'll get you set up for physical therapy  Motrin 400 mg twice daily with food

## 2013-10-16 NOTE — Progress Notes (Signed)
Pre visit review using our clinic review tool, if applicable. No additional management support is needed unless otherwise documented below in the visit note. 

## 2013-10-24 ENCOUNTER — Ambulatory Visit: Payer: 59 | Attending: Family Medicine | Admitting: Physical Therapy

## 2013-10-24 DIAGNOSIS — IMO0001 Reserved for inherently not codable concepts without codable children: Secondary | ICD-10-CM | POA: Insufficient documentation

## 2013-10-24 DIAGNOSIS — M542 Cervicalgia: Secondary | ICD-10-CM | POA: Insufficient documentation

## 2013-10-24 DIAGNOSIS — M25519 Pain in unspecified shoulder: Secondary | ICD-10-CM | POA: Insufficient documentation

## 2013-10-30 ENCOUNTER — Ambulatory Visit: Payer: 59

## 2013-11-01 ENCOUNTER — Ambulatory Visit: Payer: 59

## 2013-11-06 ENCOUNTER — Ambulatory Visit: Payer: 59

## 2013-11-08 ENCOUNTER — Ambulatory Visit: Payer: 59

## 2013-11-13 ENCOUNTER — Ambulatory Visit: Payer: 59

## 2013-11-15 ENCOUNTER — Ambulatory Visit: Payer: 59

## 2013-11-20 ENCOUNTER — Ambulatory Visit: Payer: 59 | Attending: Family Medicine

## 2013-11-20 DIAGNOSIS — M25519 Pain in unspecified shoulder: Secondary | ICD-10-CM | POA: Insufficient documentation

## 2013-11-20 DIAGNOSIS — M542 Cervicalgia: Secondary | ICD-10-CM | POA: Diagnosis not present

## 2013-11-20 DIAGNOSIS — IMO0001 Reserved for inherently not codable concepts without codable children: Secondary | ICD-10-CM | POA: Diagnosis present

## 2013-11-22 ENCOUNTER — Ambulatory Visit: Payer: 59 | Admitting: Physical Therapy

## 2013-11-22 DIAGNOSIS — IMO0001 Reserved for inherently not codable concepts without codable children: Secondary | ICD-10-CM | POA: Diagnosis not present

## 2013-11-27 ENCOUNTER — Ambulatory Visit: Payer: 59

## 2013-11-27 DIAGNOSIS — IMO0001 Reserved for inherently not codable concepts without codable children: Secondary | ICD-10-CM | POA: Diagnosis not present

## 2013-11-29 ENCOUNTER — Ambulatory Visit: Payer: 59

## 2013-11-29 DIAGNOSIS — IMO0001 Reserved for inherently not codable concepts without codable children: Secondary | ICD-10-CM | POA: Diagnosis not present

## 2014-03-20 ENCOUNTER — Encounter: Payer: Self-pay | Admitting: Gastroenterology

## 2014-08-28 ENCOUNTER — Other Ambulatory Visit: Payer: Self-pay | Admitting: Family Medicine

## 2014-10-02 ENCOUNTER — Encounter: Payer: Self-pay | Admitting: Family Medicine

## 2014-10-02 ENCOUNTER — Ambulatory Visit (INDEPENDENT_AMBULATORY_CARE_PROVIDER_SITE_OTHER): Payer: 59 | Admitting: Family Medicine

## 2014-10-02 VITALS — BP 140/90 | Temp 98.5°F | Ht 62.75 in | Wt 187.2 lb

## 2014-10-02 DIAGNOSIS — E039 Hypothyroidism, unspecified: Secondary | ICD-10-CM | POA: Diagnosis not present

## 2014-10-02 DIAGNOSIS — E785 Hyperlipidemia, unspecified: Secondary | ICD-10-CM

## 2014-10-02 DIAGNOSIS — E663 Overweight: Secondary | ICD-10-CM

## 2014-10-02 DIAGNOSIS — Z23 Encounter for immunization: Secondary | ICD-10-CM

## 2014-10-02 DIAGNOSIS — I10 Essential (primary) hypertension: Secondary | ICD-10-CM

## 2014-10-02 DIAGNOSIS — R030 Elevated blood-pressure reading, without diagnosis of hypertension: Secondary | ICD-10-CM

## 2014-10-02 DIAGNOSIS — F4321 Adjustment disorder with depressed mood: Secondary | ICD-10-CM

## 2014-10-02 LAB — POCT URINALYSIS DIPSTICK
Bilirubin, UA: NEGATIVE
Blood, UA: NEGATIVE
Glucose, UA: NEGATIVE
KETONES UA: NEGATIVE
Leukocytes, UA: NEGATIVE
Nitrite, UA: NEGATIVE
PH UA: 5.5
PROTEIN UA: NEGATIVE
Spec Grav, UA: 1.025
Urobilinogen, UA: 0.2

## 2014-10-02 LAB — CBC WITH DIFFERENTIAL/PLATELET
BASOS ABS: 0 10*3/uL (ref 0.0–0.1)
Basophils Relative: 0.5 % (ref 0.0–3.0)
EOS ABS: 0.1 10*3/uL (ref 0.0–0.7)
Eosinophils Relative: 1.6 % (ref 0.0–5.0)
HEMATOCRIT: 40.7 % (ref 36.0–46.0)
Hemoglobin: 13.5 g/dL (ref 12.0–15.0)
LYMPHS ABS: 3.6 10*3/uL (ref 0.7–4.0)
Lymphocytes Relative: 42.8 % (ref 12.0–46.0)
MCHC: 33.2 g/dL (ref 30.0–36.0)
MCV: 80.7 fl (ref 78.0–100.0)
Monocytes Absolute: 0.4 10*3/uL (ref 0.1–1.0)
Monocytes Relative: 4.4 % (ref 3.0–12.0)
NEUTROS ABS: 4.3 10*3/uL (ref 1.4–7.7)
Neutrophils Relative %: 50.7 % (ref 43.0–77.0)
PLATELETS: 305 10*3/uL (ref 150.0–400.0)
RBC: 5.04 Mil/uL (ref 3.87–5.11)
RDW: 15.8 % — ABNORMAL HIGH (ref 11.5–15.5)
WBC: 8.4 10*3/uL (ref 4.0–10.5)

## 2014-10-02 LAB — LIPID PANEL
CHOLESTEROL: 261 mg/dL — AB (ref 0–200)
HDL: 47.4 mg/dL (ref >39.00)
NonHDL: 213.6
TRIGLYCERIDES: 301 mg/dL — AB (ref 0.0–149.0)
Total CHOL/HDL Ratio: 6
VLDL: 60.2 mg/dL — AB (ref 0.0–40.0)

## 2014-10-02 LAB — HEPATIC FUNCTION PANEL
ALBUMIN: 4.2 g/dL (ref 3.5–5.2)
ALK PHOS: 76 U/L (ref 39–117)
ALT: 22 U/L (ref 0–35)
AST: 16 U/L (ref 0–37)
Bilirubin, Direct: 0.1 mg/dL (ref 0.0–0.3)
TOTAL PROTEIN: 7 g/dL (ref 6.0–8.3)
Total Bilirubin: 0.4 mg/dL (ref 0.2–1.2)

## 2014-10-02 LAB — BASIC METABOLIC PANEL
BUN: 17 mg/dL (ref 6–23)
CHLORIDE: 106 meq/L (ref 96–112)
CO2: 25 mEq/L (ref 19–32)
CREATININE: 0.73 mg/dL (ref 0.40–1.20)
Calcium: 9.6 mg/dL (ref 8.4–10.5)
GFR: 86.51 mL/min (ref >60.00)
Glucose, Bld: 78 mg/dL (ref 70–99)
Potassium: 3.9 mEq/L (ref 3.5–5.1)
Sodium: 140 mEq/L (ref 135–145)

## 2014-10-02 LAB — LDL CHOLESTEROL, DIRECT: LDL DIRECT: 128 mg/dL

## 2014-10-02 LAB — TSH: TSH: 2.65 u[IU]/mL (ref 0.35–4.50)

## 2014-10-02 MED ORDER — SYNTHROID 88 MCG PO TABS: 88.0000 ug | ORAL_TABLET | Freq: Every day | ORAL | Status: DC

## 2014-10-02 MED ORDER — FLUOXETINE HCL 20 MG PO CAPS: 20.0000 mg | ORAL_CAPSULE | Freq: Every day | ORAL | Status: DC

## 2014-10-02 MED ORDER — ALBUTEROL SULFATE HFA 108 (90 BASE) MCG/ACT IN AERS: 2.0000 | INHALATION_SPRAY | RESPIRATORY_TRACT | Status: DC | PRN

## 2014-10-02 MED ORDER — LISINOPRIL-HYDROCHLOROTHIAZIDE 10-12.5 MG PO TABS: 1.0000 | ORAL_TABLET | Freq: Every day | ORAL | Status: DC

## 2014-10-02 NOTE — Progress Notes (Signed)
Subjective:    Patient ID: Heidi Christensen, female    DOB: 04/22/1956, 59 y.o.   MRN: [id number]  HPI Heidi Christensen is a 59 year old female nonsmoker who works is the front office person at the [employer] who comes in today for general physical examination because of a history of mild depression, hypothyroidism, hypertension  Her blood pressure on Zestoretic 10-12 0.5 is 130/98. She states she takes her medication daily however there is stress in the family. Her husband has histoplasmosis which is become reactivated, her father is declining in health and she is the primary caregiver, and there was a death in the family.  She takes her Synthroid daily 88 g  She takes Prozac 20 mg daily for history of mild depression. She also takes albuterol when necessary if she has any wheezing  She has a history of low back pain and tells me at one point she went to see another physician who ordered an scan of her back which was normal except for some arthritis. She takes Motrin but only occasionally  She gets routine eye care, dental care, BSE monthly, due for her mammogram, colonoscopy 2010 was normal  Tetanus booster today  LMP at age 38 Pap smear last year normal therefore not repeated asymptomatic   Review of Systems  Constitutional: Negative.   HENT: Negative.   Eyes: Negative.   Respiratory: Negative.   Cardiovascular: Negative.   Gastrointestinal: Negative.   Endocrine: Negative.   Genitourinary: Negative.   Musculoskeletal: Negative.   Skin: Negative.   Allergic/Immunologic: Negative.   Neurological: Negative.   Hematological: Negative.   Psychiatric/Behavioral: Negative.        Objective:   Physical Exam  Constitutional: She appears well-developed and well-nourished.  HENT:  Head: Normocephalic and atraumatic.  Right Ear: External ear normal.  Left Ear: External ear normal.  Nose: Nose normal.  Mouth/Throat: Oropharynx is clear and moist.    Eyes: EOM are normal. Pupils are equal, round, and reactive to light.  Neck: Normal range of motion. Neck supple. No JVD present. No tracheal deviation present. No thyromegaly present.  Cardiovascular: Normal rate, regular rhythm, normal heart sounds and intact distal pulses.  Exam reveals no gallop and no friction rub.   No murmur heard. Pulmonary/Chest: Effort normal and breath sounds normal. No stridor. No respiratory distress. She has no wheezes. She has no rales. She exhibits no tenderness.  Abdominal: Soft. Bowel sounds are normal. She exhibits no distension and no mass. There is no tenderness. There is no rebound and no guarding.  Genitourinary:  Bilateral breast exam normal  Musculoskeletal: Normal range of motion.  Lymphadenopathy:    She has no cervical adenopathy.  Neurological: She is alert. She has normal reflexes. No cranial nerve deficit. She exhibits normal muscle tone. Coordination normal.  Skin: Skin is warm and dry. No rash noted. No erythema. No pallor.  Psychiatric: She has a normal mood and affect. Her behavior is normal. Judgment and thought content normal.  Nursing note and vitals reviewed.         Assessment & Plan:  Healthy female  Hypertension not at goal....... BP check daily follow-up in 4 weeks  Hypothyroidism....... continue Synthroid check labs  History of mild depression........... continue Prozac 20 mg daily  Overweight....... 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 discussed diet exercise weight loss and Tylenol 2 tabs twice a day........ avoid NSAIDs because of the ACE inhibitor for her  blood pressure

## 2014-10-02 NOTE — Patient Instructions (Signed)
Omron pump up digital blood pressure cuff....... check your blood pressure daily in the morning  Return in 4 weeks for follow-up  Begin a walking program........ 30 minutes daily  Tylenol....... 2 tabs twice daily for back pain........... no NSAIDs  Continue the thyroid ..... One tablet daily  Prozac 20 mg....... one tablet daily

## 2014-11-04 ENCOUNTER — Ambulatory Visit (INDEPENDENT_AMBULATORY_CARE_PROVIDER_SITE_OTHER): Payer: 59 | Admitting: Family Medicine

## 2014-11-04 ENCOUNTER — Encounter: Payer: Self-pay | Admitting: Family Medicine

## 2014-11-04 VITALS — BP 110/80 | Temp 99.1°F | Wt 186.0 lb

## 2014-11-04 DIAGNOSIS — I10 Essential (primary) hypertension: Secondary | ICD-10-CM | POA: Diagnosis not present

## 2014-11-04 NOTE — Progress Notes (Signed)
Pre visit review using our clinic review tool, if applicable. No additional management support is needed unless otherwise documented below in the visit note. 

## 2014-11-04 NOTE — Patient Instructions (Signed)
Continue the Zestoretic......... one half tab daily in the morning  Labs today....... I will call you if it's abnormal  Return next April for your annual physical examination........... Tommi Rumps nafzinger will be taking over for me starting June 1........ he is our new Designer, jewellery from [facility]

## 2014-11-04 NOTE — Progress Notes (Signed)
   Subjective:    Patient ID: Heidi Christensen, female    DOB: 19-Feb-1956, 59 y.o.   MRN: [id number]  HPI Heidi Christensen is a 59 year old female nonsmoker who comes in today for follow-up of hypertension  We found her to have hypertension back in April. We started her on Zestoretic 10-12 0.5. That dropped her blood pressure too low. We cut the medication in half. BP today now at 12 noon 110/80. When she checks her blood pressure in the morning before she takes her medication the systolics range from 671 the 146. However a couple hours after medication her blood pressure drops to normal.   Review of Systems    review of systems otherwise negative Objective:   Physical Exam Well-developed well-nourished female no acute distress vital signs stable she's afebrile BP right arm sitting position 110/80       Assessment & Plan:  Hypertension at goal........... continue Zestoretic one half tab daily BP check weekly follow-up when necessary

## 2014-11-29 ENCOUNTER — Other Ambulatory Visit: Payer: Self-pay | Admitting: Family Medicine

## 2014-12-02 ENCOUNTER — Telehealth: Payer: Self-pay

## 2014-12-02 NOTE — Telephone Encounter (Signed)
Pt. Had last mammogram 06/29/13, not due for one yet. (every 2 yrs)

## 2015-01-03 ENCOUNTER — Encounter: Payer: Self-pay | Admitting: *Deleted

## 2015-08-29 MED FILL — SYNTHROID 88 MCG TABLET: 88 | 90 days supply | Qty: 90 | Fill #3

## 2015-08-29 MED FILL — FLUoxetine HCL 20 MG CAPS: 20 | 90 days supply | Qty: 90 | Fill #2

## 2015-11-26 ENCOUNTER — Other Ambulatory Visit: Payer: Self-pay | Admitting: Family Medicine

## 2015-11-27 MED FILL — FLUoxetine HCL 20 MG CAPS: 20 | 90 days supply | Qty: 90 | Fill #0

## 2015-11-27 MED FILL — SYNTHROID 88 MCG TABLET: 88 | 90 days supply | Qty: 90 | Fill #0

## 2015-11-27 MED FILL — LISINOPRIL-HCTZ 10-12.5 MG: 10-12.5 | 90 days supply | Qty: 90 | Fill #0

## 2016-03-01 ENCOUNTER — Other Ambulatory Visit: Payer: Self-pay | Admitting: Family Medicine

## 2016-03-01 MED FILL — SYNTHROID 88 MCG TABLET: 88 | 90 days supply | Qty: 90 | Fill #0

## 2016-03-01 MED FILL — FLUoxetine HCL 20 MG CAPS: 20 | 90 days supply | Qty: 90 | Fill #0

## 2016-03-11 ENCOUNTER — Encounter: Payer: Self-pay | Admitting: Family Medicine

## 2016-03-11 ENCOUNTER — Ambulatory Visit (INDEPENDENT_AMBULATORY_CARE_PROVIDER_SITE_OTHER): Payer: 59 | Admitting: Family Medicine

## 2016-03-11 VITALS — BP 130/80 | HR 78 | Temp 98.0°F | Ht 62.75 in | Wt 184.2 lb

## 2016-03-11 DIAGNOSIS — Z6832 Body mass index (BMI) 32.0-32.9, adult: Secondary | ICD-10-CM

## 2016-03-11 DIAGNOSIS — E039 Hypothyroidism, unspecified: Secondary | ICD-10-CM

## 2016-03-11 DIAGNOSIS — R251 Tremor, unspecified: Secondary | ICD-10-CM

## 2016-03-11 DIAGNOSIS — J4520 Mild intermittent asthma, uncomplicated: Secondary | ICD-10-CM

## 2016-03-11 DIAGNOSIS — F325 Major depressive disorder, single episode, in full remission: Secondary | ICD-10-CM

## 2016-03-11 DIAGNOSIS — I10 Essential (primary) hypertension: Secondary | ICD-10-CM

## 2016-03-11 DIAGNOSIS — E785 Hyperlipidemia, unspecified: Secondary | ICD-10-CM

## 2016-03-11 LAB — COMPREHENSIVE METABOLIC PANEL
ALT: 17 U/L (ref 0–35)
AST: 11 U/L (ref 0–37)
Albumin: 4.2 g/dL (ref 3.5–5.2)
Alkaline Phosphatase: 71 U/L (ref 39–117)
BILIRUBIN TOTAL: 0.4 mg/dL (ref 0.2–1.2)
BUN: 19 mg/dL (ref 6–23)
CALCIUM: 9.2 mg/dL (ref 8.4–10.5)
CO2: 29 meq/L (ref 19–32)
Chloride: 107 mEq/L (ref 96–112)
Creatinine, Ser: 0.77 mg/dL (ref 0.40–1.20)
GFR: 80.96 mL/min (ref >60.00)
Glucose, Bld: 88 mg/dL (ref 70–99)
Potassium: 4.5 mEq/L (ref 3.5–5.1)
Sodium: 142 mEq/L (ref 135–145)
Total Protein: 6.8 g/dL (ref 6.0–8.3)

## 2016-03-11 LAB — LIPID PANEL
CHOL/HDL RATIO: 6
CHOLESTEROL: 270 mg/dL — AB (ref 0–200)
HDL: 48.2 mg/dL (ref >39.00)
NonHDL: 221.89
TRIGLYCERIDES: 259 mg/dL — AB (ref 0.0–149.0)
VLDL: 51.8 mg/dL — AB (ref 0.0–40.0)

## 2016-03-11 LAB — T4, FREE: FREE T4: 0.89 ng/dL (ref 0.60–1.60)

## 2016-03-11 LAB — LDL CHOLESTEROL, DIRECT: LDL DIRECT: 158 mg/dL

## 2016-03-11 LAB — TSH: TSH: 5.07 u[IU]/mL — ABNORMAL HIGH (ref 0.35–4.50)

## 2016-03-11 MED ORDER — LISINOPRIL-HYDROCHLOROTHIAZIDE 10-12.5 MG PO TABS: 0.5000 | ORAL_TABLET | Freq: Every day | ORAL | 0 refills | Status: DC

## 2016-03-11 MED ORDER — ALBUTEROL SULFATE HFA 108 (90 BASE) MCG/ACT IN AERS: 2.0000 | INHALATION_SPRAY | RESPIRATORY_TRACT | 2 refills | Status: DC | PRN

## 2016-03-11 MED FILL — VENTOLIN HFA 90 MCG INHALER: 108 (90 BAS | 16 days supply | Qty: 18 | Fill #0

## 2016-03-11 NOTE — Progress Notes (Signed)
HPI:   [name] is a 60 y.o. female, who is here today to establish care with me and to follow on some of her chronic medical problems.  Former PCP: Dr Sherren Mocha Last preventive routine visit: 09/2014  She lives with her husband and son. She does not exercise regularly but she tries to follow a healthy diet.   Hypertension: Currently she is on Lisinopril-HCTZ 10-12.5 mg 1/2 tab daily, according to pt, she decreased dose in 2013 because was having dizziness. Reporting no side effects from medication at current dose. She  denies any frequent/severe headache, visual changes, chest pain, dyspnea, palpitation, abdominal pain, nausea, vomiting, or edema.     Chemistry      Component Value Date/Time   NA 140 10/02/2014 1051   K 3.9 10/02/2014 1051   CL 106 10/02/2014 1051   CO2 25 10/02/2014 1051   BUN 17 10/02/2014 1051   CREATININE 0.73 10/02/2014 1051      Component Value Date/Time   CALCIUM 9.6 10/02/2014 1051   ALKPHOS 76 10/02/2014 1051   AST 16 10/02/2014 1051   ALT 22 10/02/2014 1051   BILITOT 0.4 10/02/2014 1051       Hyperlipidemia:  Currently on non pharmacologic treatment. Following a low fat diet: Yes.  She states that she was on medication before but was recommended stopping it. She does not recall having side effects with medication.   Lab Results  Component Value Date   CHOL 261 (H) 10/02/2014   HDL 47.40 10/02/2014   LDLDIRECT 128.0 10/02/2014   TRIG 301.0 (H) 10/02/2014   CHOLHDL 6 10/02/2014    Asthma: He is currently on Albuterol inhaler, which she uses about one- twice per month for wheezing, cough, or exertional dyspnea. Symptoms are usually exacerbated by seasonal changes.  Hypothyroidism:  Currently she is on Synthroid 88 mcg daily . Tolerating medication well, no side effects reported. She has not noted dysphagia, palpitations, abdominal pain, changes in bowel habits, cold/heat intolerance, or abnormal weight  loss.  Today I noted mild head tremor, she reports having it for a while, not sure about causes and stable. Not aware of FHx of tremor. Denies alcohol abuse.   Lab Results  Component Value Date   TSH 2.65 10/02/2014    Depression: She has been on Prozac for about 20 years. She denies any symptoms but states that she has skipped medication for a few days the past because she forgot to bring med (vaccation/travel) and noted some anxiety a few days later.  She denies any history of bipolar disorder. She has no problems with sleep and denies suicidal thoughts.    Review of Systems  Constitutional: Negative for activity change, appetite change, fatigue, fever and unexpected weight change.  HENT: Negative for mouth sores, nosebleeds and trouble swallowing.   Eyes: Negative for redness and visual disturbance.  Respiratory: Negative for cough, shortness of breath and wheezing.   Cardiovascular: Negative for chest pain, palpitations and leg swelling.  Gastrointestinal: Negative for abdominal pain, nausea and vomiting.       Negative for changes in bowel habits.  Endocrine: Negative for cold intolerance and heat intolerance.  Genitourinary: Negative for decreased urine volume, difficulty urinating, dysuria and hematuria.  Musculoskeletal: Negative for back pain and myalgias.  Skin: Negative for color change and rash.  Allergic/Immunologic: Positive for environmental allergies.  Neurological: Negative for seizures, syncope, weakness, numbness and headaches.  Psychiatric/Behavioral: Negative for confusion, hallucinations, sleep disturbance and suicidal ideas.  The patient is not nervous/anxious.       Current Outpatient Prescriptions on File Prior to Visit  Medication Sig Dispense Refill  . FLUoxetine (PROZAC) 20 MG capsule TAKE 1 CAPSULE BY MOUTH ONCE DAILY 100 capsule 0  . SYNTHROID 88 MCG tablet TAKE 1 TABLET BY MOUTH ONCE DAILY. PATIENT NEEDS APPOINTMENT 100 tablet 0   No current  facility-administered medications on file prior to visit.      Past Medical History:  Diagnosis Date  . Arthritis   . Asthma   . Cancer ([place])   . Thyroid disease    No Known Allergies  Family History  Problem Relation Age of Onset  . COPD Mother   . Hypertension Father   . COPD Brother     Social History   Social History  . Marital status: Married    Spouse name: N/A  . Number of children: N/A  . Years of education: N/A   Social History Main Topics  . Smoking status: Former Smoker    Types: Cigarettes    Quit date: 11/22/1981  . Smokeless tobacco: Never Used  . Alcohol use No  . Drug use: No  . Sexual activity: Yes   Other Topics Concern  . None   Social History Narrative  . None    Vitals:   03/11/16 0823  BP: 130/80  Pulse: 78  Temp: 98 F (36.7 C)   O2 sat at RA 96%.  Body mass index is 32.9 kg/m.    Physical Exam  Nursing note and vitals reviewed. Constitutional: She is oriented to person, place, and time. She appears well-developed. No distress.  HENT:  Head: Atraumatic.  Mouth/Throat: Oropharynx is clear and moist and mucous membranes are normal.  Eyes: Conjunctivae and EOM are normal. Pupils are equal, round, and reactive to light.  Neck: No thyroid mass and no thyromegaly present.  Cardiovascular: Normal rate and regular rhythm.   No murmur heard. Pulses:      Dorsalis pedis pulses are 2+ on the right side, and 2+ on the left side.  Respiratory: Effort normal and breath sounds normal. No respiratory distress.  GI: Soft. She exhibits no mass. There is no hepatomegaly. There is no tenderness.  Musculoskeletal: She exhibits no edema.  Lymphadenopathy:    She has no cervical adenopathy.  Neurological: She is alert and oriented to person, place, and time. She has normal strength. Coordination and gait normal.  Noted mild head tremor, fine hand tremor with intention. Pronator drift negative.  Skin: Skin is warm. No erythema.  Psychiatric:  She has a normal mood and affect.  Well groomed, good eye contact.      ASSESSMENT AND PLAN:    Heidi Christensen was seen today for transfer.  Diagnoses and all orders for this visit:   Asthma, mild intermittent, uncomplicated  Well controlled. No changes in current management. Follow-up in 12 months, before if needed.   -     albuterol (PROVENTIL HFA;VENTOLIN HFA) 108 (90 Base) MCG/ACT inhaler; Inhale 2 puffs into the lungs every 4 (four) hours as needed for wheezing (cough, shortness of breath or wheezing.).  Hypothyroidism, unspecified hypothyroidism type  No changes in current management, will follow labs done today and will give further recommendations accordingly.  -     TSH -     T4, Free  Hyperlipidemia  I will wait for lab results before we decide about pharmacologic treatment and dose. Continue low-fat diet. F/U in 6 months.  -  Lipid panel -     CMP  BMI 32.0-32.9,adult  We discussed benefits of wt loss as well as adverse effects of obesity. Consistency with healthy diet and physical activity recommended. If interested Weight Watchers is a good option as well as daily brisk walking for 15-30 min as tolerated.  Major depression in remission ([place])  Seems to be well controlled. For now no changes in current management, we could try decreasing dose later on. Follow-up in 6-12 months.   Tremor, unspecified  Mild and reported as stable. For now we will continue monitoring it. We discussed possible causes, most likely essential tremor.  Essential hypertension  Adequately controlled. No changes in current management. Low sat diet recommended. Periodic eye exam. F/U in 6 months, before if needed.  -     lisinopril-hydrochlorothiazide (PRINZIDE,ZESTORETIC) 10-12.5 MG tablet; Take 0.5 tablets by mouth daily.      -Planning on getting her flu shot this year through work in a couple weeks. -We decided to hold on pneumonia vaccination (Pneumovax) until  her physical.        Issai Werling G. Martinique, MD  [facility]. [place] office.

## 2016-03-11 NOTE — Progress Notes (Signed)
Pre visit review using our clinic review tool, if applicable. No additional management support is needed unless otherwise documented below in the visit note. 

## 2016-03-11 NOTE — Patient Instructions (Signed)
A few things to remember from today's visit:   Hypothyroidism, unspecified hypothyroidism type - Plan: TSH, T4, Free  Hyperlipidemia - Plan: Lipid panel, CMP  Major depression in remission ([place])  BMI 32.0-32.9,adult  Asthma, mild intermittent, uncomplicated - Plan: albuterol (PROVENTIL HFA;VENTOLIN HFA) 108 (90 Base) MCG/ACT inhaler  [name], today we have followed on some of your chronic medical problems and they seem to be stable, so no changes in current management today.  Review medication list and be sure if is accurate. Cholesterol lab pending, I may recommend medication.   -Remember a healthy diet and regular physical activity are very important for prevention as well as for well being; they also help with many chronic problems, decreasing the need of adding new medications and delaying or preventing possible complications.  Remember to arrange your follow up appt before leaving today. Please follow sooner than planned if a new concern arises.   Please be sure medication list is accurate. If a new problem present, please set up appointment sooner than planned today.

## 2016-03-14 ENCOUNTER — Encounter: Payer: Self-pay | Admitting: Family Medicine

## 2016-05-26 ENCOUNTER — Other Ambulatory Visit: Payer: Self-pay | Admitting: Family Medicine

## 2016-05-26 MED FILL — LISINOPRIL-HCTZ 10-12.5 MG: 10-12.5 | 30 days supply | Qty: 30 | Fill #0

## 2016-05-27 MED FILL — SYNTHROID 88 MCG TABLET: 88 | 90 days supply | Qty: 90 | Fill #0

## 2016-05-27 MED FILL — FLUoxetine HCL 20 MG CAPS: 20 | 90 days supply | Qty: 90 | Fill #0

## 2016-05-27 NOTE — Telephone Encounter (Signed)
Has established with Dr. Martinique

## 2016-06-02 ENCOUNTER — Encounter: Payer: Self-pay | Admitting: Family Medicine

## 2016-06-03 ENCOUNTER — Other Ambulatory Visit: Payer: Self-pay

## 2016-06-03 DIAGNOSIS — I10 Essential (primary) hypertension: Secondary | ICD-10-CM

## 2016-06-03 MED ORDER — SIMVASTATIN 20 MG PO TABS: 20.0000 mg | ORAL_TABLET | Freq: Every day | ORAL | 1 refills | Status: DC

## 2016-06-03 MED ORDER — LISINOPRIL-HYDROCHLOROTHIAZIDE 10-12.5 MG PO TABS: 0.5000 | ORAL_TABLET | Freq: Every day | ORAL | 1 refills | Status: DC

## 2016-06-09 MED FILL — SIMVASTATIN 20 MG TABLET: 20 | 90 days supply | Qty: 90 | Fill #0

## 2016-07-01 ENCOUNTER — Telehealth: Payer: 59 | Admitting: Physician Assistant

## 2016-07-01 DIAGNOSIS — M5440 Lumbago with sciatica, unspecified side: Secondary | ICD-10-CM | POA: Diagnosis not present

## 2016-07-01 MED ORDER — TIZANIDINE HCL 2 MG PO TABS: 2.0000 mg | ORAL_TABLET | Freq: Four times a day (QID) | ORAL | 0 refills | Status: DC | PRN

## 2016-07-01 MED ORDER — NAPROXEN 500 MG PO TABS: 500.0000 mg | ORAL_TABLET | Freq: Two times a day (BID) | ORAL | 0 refills | Status: DC

## 2016-07-01 MED FILL — tiZANidine HCL 4 MG TABS: 4 | 4 days supply | Qty: 8 | Fill #0

## 2016-07-01 MED FILL — NAPROXEN 500 MG TABLET: 500 | 7 days supply | Qty: 15 | Fill #0

## 2016-07-01 NOTE — Progress Notes (Signed)

## 2016-07-12 ENCOUNTER — Telehealth: Payer: Self-pay | Admitting: Family Medicine

## 2016-07-12 NOTE — Telephone Encounter (Signed)
Patient requesting to change pcp from Dr. Martinique to Elyn Aquas.  Patient lives in [place].

## 2016-07-12 NOTE — Telephone Encounter (Signed)
Ok with me 

## 2016-07-12 NOTE — Telephone Encounter (Signed)
This is fine. Thanks

## 2016-07-21 ENCOUNTER — Ambulatory Visit (INDEPENDENT_AMBULATORY_CARE_PROVIDER_SITE_OTHER): Payer: 59 | Admitting: Physician Assistant

## 2016-07-21 ENCOUNTER — Other Ambulatory Visit (HOSPITAL_COMMUNITY)
Admission: RE | Admit: 2016-07-21 | Discharge: 2016-07-21 | Disposition: A | Discharge status: Home / Self Care | Payer: 59 | Source: Ambulatory Visit | Attending: Physician Assistant | Admitting: Physician Assistant

## 2016-07-21 ENCOUNTER — Encounter: Payer: Self-pay | Admitting: Physician Assistant

## 2016-07-21 VITALS — BP 112/78 | HR 74 | Temp 98.4°F | Resp 14 | Ht 63.0 in | Wt 186.0 lb

## 2016-07-21 DIAGNOSIS — E785 Hyperlipidemia, unspecified: Secondary | ICD-10-CM | POA: Diagnosis not present

## 2016-07-21 DIAGNOSIS — Z01419 Encounter for gynecological examination (general) (routine) without abnormal findings: Secondary | ICD-10-CM | POA: Diagnosis not present

## 2016-07-21 DIAGNOSIS — I10 Essential (primary) hypertension: Secondary | ICD-10-CM | POA: Diagnosis not present

## 2016-07-21 DIAGNOSIS — R251 Tremor, unspecified: Secondary | ICD-10-CM | POA: Diagnosis not present

## 2016-07-21 DIAGNOSIS — Z0000 Encounter for general adult medical examination without abnormal findings: Secondary | ICD-10-CM | POA: Diagnosis not present

## 2016-07-21 DIAGNOSIS — Z124 Encounter for screening for malignant neoplasm of cervix: Secondary | ICD-10-CM

## 2016-07-21 DIAGNOSIS — Z1239 Encounter for other screening for malignant neoplasm of breast: Secondary | ICD-10-CM

## 2016-07-21 DIAGNOSIS — E039 Hypothyroidism, unspecified: Secondary | ICD-10-CM

## 2016-07-21 DIAGNOSIS — F325 Major depressive disorder, single episode, in full remission: Secondary | ICD-10-CM | POA: Diagnosis not present

## 2016-07-21 DIAGNOSIS — Z1231 Encounter for screening mammogram for malignant neoplasm of breast: Secondary | ICD-10-CM | POA: Diagnosis not present

## 2016-07-21 DIAGNOSIS — Z1151 Encounter for screening for human papillomavirus (HPV): Secondary | ICD-10-CM | POA: Diagnosis not present

## 2016-07-21 DIAGNOSIS — Z0184 Encounter for antibody response examination: Secondary | ICD-10-CM | POA: Diagnosis not present

## 2016-07-21 LAB — COMPREHENSIVE METABOLIC PANEL
ALBUMIN: 4.6 g/dL (ref 3.5–5.2)
ALT: 16 U/L (ref 0–35)
AST: 13 U/L (ref 0–37)
Alkaline Phosphatase: 69 U/L (ref 39–117)
BUN: 23 mg/dL (ref 6–23)
CALCIUM: 9.5 mg/dL (ref 8.4–10.5)
CHLORIDE: 107 meq/L (ref 96–112)
CO2: 28 meq/L (ref 19–32)
Creatinine, Ser: 0.74 mg/dL (ref 0.40–1.20)
GFR: 84.65 mL/min (ref >60.00)
Glucose, Bld: 94 mg/dL (ref 70–99)
POTASSIUM: 4.3 meq/L (ref 3.5–5.1)
Sodium: 140 mEq/L (ref 135–145)
Total Bilirubin: 0.4 mg/dL (ref 0.2–1.2)
Total Protein: 6.9 g/dL (ref 6.0–8.3)

## 2016-07-21 LAB — TSH: TSH: 2.7 u[IU]/mL (ref 0.35–4.50)

## 2016-07-21 LAB — URINALYSIS, ROUTINE W REFLEX MICROSCOPIC
Bilirubin Urine: NEGATIVE
Hgb urine dipstick: NEGATIVE
KETONES UR: NEGATIVE
Leukocytes, UA: NEGATIVE
NITRITE: NEGATIVE
RBC / HPF: NONE SEEN (ref 0–?)
SPECIFIC GRAVITY, URINE: 1.025 (ref 1.000–1.030)
TOTAL PROTEIN, URINE-UPE24: NEGATIVE
URINE GLUCOSE: NEGATIVE
UROBILINOGEN UA: 0.2 (ref 0.0–1.0)
pH: 5.5 (ref 5.0–8.0)

## 2016-07-21 LAB — CBC
HEMATOCRIT: 40.7 % (ref 36.0–46.0)
HEMOGLOBIN: 13.5 g/dL (ref 12.0–15.0)
MCHC: 33.1 g/dL (ref 30.0–36.0)
MCV: 82.2 fl (ref 78.0–100.0)
PLATELETS: 312 10*3/uL (ref 150.0–400.0)
RBC: 4.95 Mil/uL (ref 3.87–5.11)
RDW: 14.9 % (ref 11.5–15.5)
WBC: 7.5 10*3/uL (ref 4.0–10.5)

## 2016-07-21 LAB — LIPID PANEL
CHOL/HDL RATIO: 4
CHOLESTEROL: 188 mg/dL (ref 0–200)
HDL: 51.8 mg/dL (ref >39.00)
NonHDL: 136.58
TRIGLYCERIDES: 218 mg/dL — AB (ref 0.0–149.0)
VLDL: 43.6 mg/dL — AB (ref 0.0–40.0)

## 2016-07-21 LAB — HEMOGLOBIN A1C: HEMOGLOBIN A1C: 6 % (ref 4.6–6.5)

## 2016-07-21 LAB — LDL CHOLESTEROL, DIRECT: Direct LDL: 85 mg/dL

## 2016-07-21 MED ORDER — LISINOPRIL-HYDROCHLOROTHIAZIDE 10-12.5 MG PO TABS
0.5000 | ORAL_TABLET | Freq: Every day | ORAL | 1 refills | Status: DC
Start: 2016-07-21 — End: 2017-03-07

## 2016-07-21 MED FILL — LISINOPRIL-HCTZ 10-12.5 MG: 10-12.5 | 90 days supply | Qty: 45 | Fill #0

## 2016-07-21 NOTE — Patient Instructions (Signed)
Please go to the lab for blood work.   Our office will call you with your results unless you have chosen to receive results via MyChart.  If your blood work is normal we will follow-up each year for physicals and as scheduled for chronic medical problems.  If anything is abnormal we will treat accordingly and get you in for a follow-up.  You will be contacted for a 3D screening mammogram. I am setting you up with Neurology for your tremor for further assessment.    Preventive Care 40-64 Years, Female Preventive care refers to lifestyle choices and visits with your health care provider that can promote health and wellness. What does preventive care include?  A yearly physical exam. This is also called an annual well check.  Dental exams once or twice a year.  Routine eye exams. Ask your health care provider how often you should have your eyes checked.  Personal lifestyle choices, including:  Daily care of your teeth and gums.  Regular physical activity.  Eating a healthy diet.  Avoiding tobacco and drug use.  Limiting alcohol use.  Practicing safe sex.  Taking low-dose aspirin daily starting at age 41.  Taking vitamin and mineral supplements as recommended by your health care provider. What happens during an annual well check? The services and screenings done by your health care provider during your annual well check will depend on your age, overall health, lifestyle risk factors, and family history of disease. Counseling  Your health care provider may ask you questions about your:  Alcohol use.  Tobacco use.  Drug use.  Emotional well-being.  Home and relationship well-being.  Sexual activity.  Eating habits.  Work and work Statistician.  Method of birth control.  Menstrual cycle.  Pregnancy history. Screening  You may have the following tests or measurements:  Height, weight, and BMI.  Blood pressure.  Lipid and cholesterol levels. These may be  checked every 5 years, or more frequently if you are over 1 years old.  Skin check.  Lung cancer screening. You may have this screening every year starting at age 31 if you have a 30-pack-year history of smoking and currently smoke or have quit within the past 15 years.  Fecal occult blood test (FOBT) of the stool. You may have this test every year starting at age 73.  Flexible sigmoidoscopy or colonoscopy. You may have a sigmoidoscopy every 5 years or a colonoscopy every 10 years starting at age 44.  Hepatitis C blood test.  Hepatitis B blood test.  Sexually transmitted disease (STD) testing.  Diabetes screening. This is done by checking your blood sugar (glucose) after you have not eaten for a while (fasting). You may have this done every 1-3 years.  Mammogram. This may be done every 1-2 years. Talk to your health care provider about when you should start having regular mammograms. This may depend on whether you have a family history of breast cancer.  BRCA-related cancer screening. This may be done if you have a family history of breast, ovarian, tubal, or peritoneal cancers.  Pelvic exam and Pap test. This may be done every 3 years starting at age 54. Starting at age 18, this may be done every 5 years if you have a Pap test in combination with an HPV test.  Bone density scan. This is done to screen for osteoporosis. You may have this scan if you are at high risk for osteoporosis. Discuss your test results, treatment options, and if necessary, the  need for more tests with your health care provider. Vaccines  Your health care provider may recommend certain vaccines, such as:  Influenza vaccine. This is recommended every year.  Tetanus, diphtheria, and acellular pertussis (Tdap, Td) vaccine. You may need a Td booster every 10 years.  Varicella vaccine. You may need this if you have not been vaccinated.  Zoster vaccine. You may need this after age 38.  Measles, mumps, and  rubella (MMR) vaccine. You may need at least one dose of MMR if you were born in 1957 or later. You may also need a second dose.  Pneumococcal 13-valent conjugate (PCV13) vaccine. You may need this if you have certain conditions and were not previously vaccinated.  Pneumococcal polysaccharide (PPSV23) vaccine. You may need one or two doses if you smoke cigarettes or if you have certain conditions.  Meningococcal vaccine. You may need this if you have certain conditions.  Hepatitis A vaccine. You may need this if you have certain conditions or if you travel or work in places where you may be exposed to hepatitis A.  Hepatitis B vaccine. You may need this if you have certain conditions or if you travel or work in places where you may be exposed to hepatitis B.  Haemophilus influenzae type b (Hib) vaccine. You may need this if you have certain conditions. Talk to your health care provider about which screenings and vaccines you need and how often you need them. This information is not intended to replace advice given to you by your health care provider. Make sure you discuss any questions you have with your health care provider. Document Released: 07/04/2015 Document Revised: 02/25/2016 Document Reviewed: 04/08/2015 Elsevier Interactive Patient Education  2017 [employer].

## 2016-07-21 NOTE — Progress Notes (Signed)
Pre visit review using our clinic review tool, if applicable. No additional management support is needed unless otherwise documented below in the visit note. 

## 2016-07-21 NOTE — Progress Notes (Signed)
Patient presents to clinic today as a transfer of care and for annual exam.  Patient is fasting for labs.  Acute Concerns: Patient endorses discussing tremor with her previous PCP. Noted tremor of R hand and neck. Was told was essential tremor but no further instruction given. Patient would like to see Neurology for assessment.   Chronic Issues: Hypertension -- Currently on regimen of Lisinopril-HCTZ. Is taking medication as directed.Patient denies chest pain, palpitations, lightheadedness, dizziness, vision changes or frequent headaches.  BP Readings from Last 3 Encounters:  07/21/16 112/78  03/11/16 130/80  11/04/14 110/80   Hyperlipidemia -- Currently o simvastatin 20 mg daily. Denies history of stroke or heart attack. Denies history of diabetes. Former smoker -- quit 40 years. 1ppd - 10 years. No regular exercise at present. Endorses well-balanced diet overall. Recently stopped consumption of red meat. Good vegetable consumption. Body mass index is 32.95 kg/m.  Hypothyroidism -- Endorses 12-year history. Currently on synthroid 88 mcg daily. Is taking as directed.   Lab Results  Component Value Date   TSH 2.70 07/21/2016    Depression -- Currently on Fluoxetine 20 mg daily. Is taking as directed. 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 Family struggles.  Recent vacation to help decompress.  Father lives by himself.  Denies SI/HI. Denies panic attacks.   Health Maintenance: Immunizations --up-to-date. Colonoscopy -- up-to-date. Mammogram -- Overdue. Last in 2015. + history of fibrous breast tissue.  PAP -- Overdue. Will have today. Zostavax -- Unsure of chicken pox. Will check immune status today with labs.   Past Medical History:  Diagnosis Date  . Arthritis   . Asthma   . Cancer ([place])    Glioblastoma at 61 years old  . Thyroid disease     Past Surgical History:  Procedure Laterality Date  . BRAIN SURGERY    . CHOLECYSTECTOMY      Current  Outpatient Prescriptions on File Prior to Visit  Medication Sig Dispense Refill  . albuterol (PROVENTIL HFA;VENTOLIN HFA) 108 (90 Base) MCG/ACT inhaler Inhale 2 puffs into the lungs every 4 (four) hours as needed for wheezing (cough, shortness of breath or wheezing.). 1 Inhaler 2  . FLUoxetine (PROZAC) 20 MG capsule TAKE 1 CAPSULE BY MOUTH ONCE DAILY 90 capsule 0  . simvastatin (ZOCOR) 20 MG tablet Take 1 tablet (20 mg total) by mouth at bedtime. 90 tablet 1  . SYNTHROID 88 MCG tablet Take 1 tablet (88 mcg total) by mouth daily. 90 tablet 0   No current facility-administered medications on file prior to visit.     No Known Allergies  Family History  Problem Relation Age of Onset  . COPD Mother   . Hypertension Father   . Dementia Father   . COPD Brother     Social History   Social History  . Marital status: Married    Spouse name: N/A  . Number of children: 3  . Years of education: N/A   Occupational History  . Not on file.   Social History Main Topics  . Smoking status: Former Smoker    Packs/day: 1.00    Years: 10.00    Types: Cigarettes    Quit date: 11/22/1981  . Smokeless tobacco: Never Used  . Alcohol use No  . Drug use: No  . Sexual activity: Yes   Other Topics Concern  . Not on file   Social History Narrative  . No narrative on file    Review of Systems  Constitutional: Negative  for fever and weight loss.  HENT: Negative for ear discharge, ear pain, hearing loss and tinnitus.   Eyes: Negative for blurred vision, double vision, photophobia and pain.  Respiratory: Negative for cough and shortness of breath.   Cardiovascular: Negative for chest pain and palpitations.  Gastrointestinal: Negative for abdominal pain, blood in stool, constipation, diarrhea, heartburn, melena, nausea and vomiting.  Genitourinary: Negative for dysuria, flank pain, frequency, hematuria and urgency.  Musculoskeletal: Negative for falls.  Neurological: Positive for tremors.  Negative for dizziness, sensory change, speech change, loss of consciousness and headaches.  Endo/Heme/Allergies: Negative for environmental allergies.  Psychiatric/Behavioral: Positive for depression. Negative for hallucinations, substance abuse and suicidal ideas. The patient is not nervous/anxious and does not have insomnia.    BP 112/78   Pulse 74   Temp 98.4 F (36.9 C) (Oral)   Resp 14   Ht 5\' 3"  (1.6 m)   Wt 186 lb (84.4 kg)   SpO2 97%   BMI 32.95 kg/m   Physical Exam  Constitutional: She is oriented to person, place, and time and well-developed, well-nourished, and in no distress.  HENT:  Head: Normocephalic and atraumatic.  Right Ear: Tympanic membrane, external ear and ear canal normal.  Left Ear: Tympanic membrane, external ear and ear canal normal.  Nose: Nose normal. No mucosal edema.  Mouth/Throat: Uvula is midline, oropharynx is clear and moist and mucous membranes are normal. No oropharyngeal exudate or posterior oropharyngeal erythema.  Eyes: Conjunctivae are normal. Pupils are equal, round, and reactive to light.  Neck: Neck supple. No thyromegaly present.  Cardiovascular: Normal rate, regular rhythm, normal heart sounds and intact distal pulses.   Pulmonary/Chest: Effort normal and breath sounds normal. No respiratory distress. She has no wheezes. She has no rales.  Abdominal: Soft. Bowel sounds are normal. She exhibits no distension and no mass. There is no tenderness. There is no rebound and no guarding.  Genitourinary: Vagina normal, uterus normal, cervix normal, right adnexa normal and left adnexa normal. No vaginal discharge found.  Genitourinary Comments: Chaperone present.  PAP obtained.  Lymphadenopathy:    She has no cervical adenopathy.  Neurological: She is alert and oriented to person, place, and time. No cranial nerve deficit.  Resting tremor noted, slightly worsened with intent.  Tremor is bilateral with R worse than L.  Some tremor of neck/head  noted.  Skin: Skin is warm and dry. No rash noted.  Psychiatric: Affect normal.  Vitals reviewed.  Assessment/Plan: Essential hypertension BP stable. Asymptomatic. Continue medications and ASA. Will check fasting labs today.   Hypothyroidism Taking synthroid as directed. Repeat labs today  Major depression in remission [facility]) Doing well overall. 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 Discussed support groups for caregivers. Encouraged her to work on taking more time for herself. Exercise is a great option for stress relief. Continue medications as directed. Discussed return precautions.   Hyperlipidemia Taking statin and ASA as directed. Will obtain fasting labs today. Dietary and exercise recommendations reviewed.   Visit for preventive health examination Depression screen performed.  Health Maintenance reviewed -- immunizations up-to-date. Due for PAP - completed today. 3D mammogram ordered. Colonoscopy up-to-date. Preventive schedule discussed and handout given in AVS. Will obtain fasting labs today.   Tremor Likely essential. Referral to Neurology placed for further assessment.   Cervical cancer screening Exam performed today in presence of chaperone.  No abnormal findings. PAP smear obtained and sent to lab.   Breast cancer screening Order for  3D screening mammogram placed.     Leeanne Rio, PA-C

## 2016-07-22 LAB — VARICELLA ZOSTER ANTIBODY, IGG: Varicella IgG: 934.3 Index — ABNORMAL HIGH (ref <135.00)

## 2016-07-24 DIAGNOSIS — R251 Tremor, unspecified: Secondary | ICD-10-CM | POA: Insufficient documentation

## 2016-07-24 DIAGNOSIS — Z0000 Encounter for general adult medical examination without abnormal findings: Secondary | ICD-10-CM | POA: Insufficient documentation

## 2016-07-24 DIAGNOSIS — Z1239 Encounter for other screening for malignant neoplasm of breast: Secondary | ICD-10-CM | POA: Insufficient documentation

## 2016-07-24 DIAGNOSIS — Z124 Encounter for screening for malignant neoplasm of cervix: Secondary | ICD-10-CM | POA: Insufficient documentation

## 2016-07-24 NOTE — Assessment & Plan Note (Signed)
Taking statin and ASA as directed. Will obtain fasting labs today. Dietary and exercise recommendations reviewed.

## 2016-07-24 NOTE — Assessment & Plan Note (Signed)
Order for 3D screening mammogram placed. 

## 2016-07-24 NOTE — Assessment & Plan Note (Signed)
BP stable. Asymptomatic. Continue medications and ASA. Will check fasting labs today.

## 2016-07-24 NOTE — Assessment & Plan Note (Signed)
Exam performed today in presence of chaperone.  No abnormal findings. PAP smear obtained and sent to lab.

## 2016-07-24 NOTE — Assessment & Plan Note (Signed)
Depression screen performed.  Health Maintenance reviewed -- immunizations up-to-date. Due for PAP - completed today. 3D mammogram ordered. Colonoscopy up-to-date. Preventive schedule discussed and handout given in AVS. Will obtain fasting labs today.

## 2016-07-24 NOTE — Assessment & Plan Note (Signed)
Doing well overall. 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 Discussed support groups for caregivers. Encouraged her to work on taking more time for herself. Exercise is a great option for stress relief. Continue medications as directed. Discussed return precautions.

## 2016-07-24 NOTE — Assessment & Plan Note (Signed)
Likely essential. Referral to Neurology placed for further assessment.

## 2016-07-24 NOTE — Assessment & Plan Note (Signed)
Taking synthroid as directed. Repeat labs today

## 2016-07-27 LAB — CYTOLOGY - PAP
Diagnosis: NEGATIVE
HPV (WINDOPATH): NOT DETECTED

## 2016-07-27 NOTE — Progress Notes (Signed)
Subjective:   Heidi Christensen was seen in consultation in the movement disorder clinic at the request of Leeanne Rio, PA-C.  The evaluation is for tremor.  Tremor started approximately 1-1.5 years ago in the head but it seemed to go away until more recently.  Her PCP noted it in Sept in the head and R and L hand and coworkers are now noticing it, mostly in the head.   Pt notices most in the head but she can tell it is in the hands when typing.  Head tremor is in the "no" direction.  She doesn't notice it more when head is turned in certain direction.   There is a ? family hx of tremor in her father years ago; father still living but doesn't note tremor any longer.  No previous neuroimaging as adult but had many as child as had glioblastoma as a child.  Affected by caffeine:  No. (2 cups of coffee per day) Affected by alcohol:  Doesn't drink Affected by stress:  Yes.   Affected by fatigue:  No. Spills soup if on spoon:  No. Spills glass of liquid if full:  No. Affects ADL's (tying shoes, brushing teeth, etc):  No.  Current/Previously tried tremor medications: n/a  Current medications that may exacerbate tremor:  Albuterol (uses few times a year only)  Outside reports reviewed: lab reports, office notes and referral letter/letters.  No Known Allergies  Outpatient Encounter Prescriptions as of 07/28/2016  Medication Sig  . acetaminophen (TYLENOL) 500 MG tablet Take 500 mg by mouth every 6 (six) hours as needed.  Marland Kitchen albuterol (PROVENTIL HFA;VENTOLIN HFA) 108 (90 Base) MCG/ACT inhaler Inhale 2 puffs into the lungs every 4 (four) hours as needed for wheezing (cough, shortness of breath or wheezing.).  Marland Kitchen b complex vitamins tablet Take 1 tablet by mouth daily.  Marland Kitchen FLUoxetine (PROZAC) 20 MG capsule TAKE 1 CAPSULE BY MOUTH ONCE DAILY  . lisinopril-hydrochlorothiazide (PRINZIDE,ZESTORETIC) 10-12.5 MG tablet Take 0.5 tablets by mouth daily.  . simvastatin (ZOCOR) 20 MG tablet Take 1 tablet  (20 mg total) by mouth at bedtime.  Marland Kitchen SYNTHROID 88 MCG tablet Take 1 tablet (88 mcg total) by mouth daily.   No facility-administered encounter medications on file as of 07/28/2016.     Past Medical History:  Diagnosis Date  . Arthritis   . Asthma   . Cancer ([place])    Glioblastoma at 61 years old  . Thyroid disease     Past Surgical History:  Procedure Laterality Date  . BRAIN SURGERY  61 yo  . CHOLECYSTECTOMY  1998  . EXPLORATORY LAPAROTOMY  1983    Social History   Social History  . Marital status: Married    Spouse name: N/A  . Number of children: 3  . Years of education: N/A   Occupational History  . Not on file.   Social History Main Topics  . Smoking status: Former Smoker    Packs/day: 1.00    Years: 10.00    Types: Cigarettes    Quit date: 11/22/1981  . Smokeless tobacco: Never Used  . Alcohol use No  . Drug use: No  . Sexual activity: Yes   Other Topics Concern  . Not on file   Social History Narrative  . No narrative on file    Family Status  Relation Status  . Mother Deceased at age 83   Flu complication  . Father Alive   dementia  . Brother Alive  . Brother Alive  x2  . Child Alive   x3 adopted    Review of Systems A complete 10 system ROS was obtained and was negative apart from what is mentioned.   Objective:   VITALS:   Vitals:   07/28/16 0822  BP: 98/60  Pulse: 80  Weight: 187 lb (84.8 kg)  Height: 5\' 3"  (1.6 m)   Gen:  Appears stated age and in NAD. HEENT:  Normocephalic, atraumatic. The mucous membranes are moist. The superficial temporal arteries are without ropiness or tenderness.  Head is slightly turned to the right and when it is positioned to just the right of midline there is a tremor in the "no" direction.  R ear slightly to R shoulder.  No hypertrophy of SCM however.   Cardiovascular: Regular rate and rhythm. Lungs: Clear to auscultation bilaterally. Neck: There are no carotid bruits noted  bilaterally.  NEUROLOGICAL:  Orientation:  The patient is alert and oriented x 3.  Recent and remote memory are intact.  Attention span and concentration are normal.  Able to name objects and repeat without trouble.  Fund of knowledge is appropriate Cranial nerves: There is good facial symmetry. The pupils are equal round and reactive to light bilaterally. Fundoscopic exam reveals clear disc margins bilaterally. Extraocular muscles are intact and visual fields are full to confrontational testing. Speech is fluent and clear. Soft palate rises symmetrically and there is no tongue deviation. Hearing is intact to conversational tone. Tone: Tone is good throughout. Sensation: Sensation is intact to light touch and pinprick throughout (facial, trunk, extremities). Vibration is intact at the bilateral big toe. There is no extinction with double simultaneous stimulation. There is no sensory dermatomal level identified. Coordination:  The patient has no dysdiadichokinesia or dysmetria. Motor: Strength is 5/5 in the bilateral upper and lower extremities.  Shoulder shrug is equal bilaterally.  There is no pronator drift.  There are no fasciculations noted. DTR's: Deep tendon reflexes are 2+-3-/4 at the bilateral biceps, triceps, brachioradialis, patella and achilles.  Plantar responses are downgoing bilaterally. Gait and Station: The patient is able to ambulate without difficulty. The patient is able to heel toe walk without any difficulty. The patient is able to ambulate in a tandem fashion. The patient is able to stand in the Romberg position.   MOVEMENT EXAM: Tremor:  There is no tremor in the UE, noted most significantly with action.  There is intention tremor on the L, overall mild.  The patient is able to draw Archimedes spirals without significant difficulty.  There is no tremor at rest.  The patient is able to pour water from one glass to another without spilling it.  LABS    Chemistry       Component Value Date/Time   NA 140 07/21/2016 0915   K 4.3 07/21/2016 0915   CL 107 07/21/2016 0915   CO2 28 07/21/2016 0915   BUN 23 07/21/2016 0915   CREATININE 0.74 07/21/2016 0915      Component Value Date/Time   CALCIUM 9.5 07/21/2016 0915   ALKPHOS 69 07/21/2016 0915   AST 13 07/21/2016 0915   ALT 16 07/21/2016 0915   BILITOT 0.4 07/21/2016 0915     Lab Results  Component Value Date   TSH 2.70 07/21/2016   No results found for: VITAMINB12      Assessment/Plan:   1.  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   The primary muscles involved are the L SCM, R spenius, R levator.  We talked about treatments.  We talked about the value of botox.  The patient was educated on the botulinum toxin the black blox warning and given a copy of the botox patient medication guide.  The patient understands that this warning states that there have been reported cases of the Botox extending beyond the injection site and creating adverse effects, similar to those of botulism. This included loss of strength, trouble walking, hoarseness, trouble saying words clearly, loss of bladder control, trouble breathing, trouble swallowing, diplopia, blurry vision and ptosis. Most of the distant spread of Botox was happening in patients, primarily children, who received medication for spasticity or for cervical dystonia. The patient wants to think about this, which I think is reasonable given the fact that it is overall quite mild.  -I talked to her about potentially trying clonazepam.  I also talked to her about its potentially, albeit low, addictive nature.  She just doesn't want to take more medication right now and hand tremor really is not that bothersome.  I told her head tremor really is more stubborn and more difficult to respond to medications than hand tremor is.  -After talking  to the patient about nature and etiology of cervical dystonia, she states that she did have a traumatic incident with a chiropractor around the same time that this started.  I educated her on the fact that cervical dystonia can sometimes be traumatic in nature.  We will go ahead and do an MRI of the cervical spine to make sure that nothing else is going on.  Given her history of a glioblastoma many years ago as a child, we will also go ahead and do an MRI of the brain  2.  I encouraged the patient to call me if she wanted to pursue any of the above treatments.  Otherwise, I will follow up with her on an as-needed basis.  Much greater than 50% of this 45 minute visit was spent in counseling with the patient.    CC:  Leeanne Rio, PA-C

## 2016-07-28 ENCOUNTER — Encounter: Payer: Self-pay | Admitting: Neurology

## 2016-07-28 ENCOUNTER — Ambulatory Visit (INDEPENDENT_AMBULATORY_CARE_PROVIDER_SITE_OTHER): Payer: 59 | Admitting: Neurology

## 2016-07-28 VITALS — BP 98/60 | HR 80 | Ht 63.0 in | Wt 187.0 lb

## 2016-07-28 DIAGNOSIS — G249 Dystonia, unspecified: Secondary | ICD-10-CM

## 2016-07-28 DIAGNOSIS — C719 Malignant neoplasm of brain, unspecified: Secondary | ICD-10-CM

## 2016-07-28 DIAGNOSIS — M542 Cervicalgia: Secondary | ICD-10-CM

## 2016-07-28 DIAGNOSIS — R251 Tremor, unspecified: Secondary | ICD-10-CM

## 2016-07-28 NOTE — Patient Instructions (Addendum)
1. Let us know if you are interested in Botox.   2. We have scheduled you at [facility] for your MRI on 08/03/16 at 12:00 pm. Please arrive 30 minutes prior and [place]. If you need to reschedule for any reason please call [phone].

## 2016-08-02 ENCOUNTER — Telehealth: Payer: Self-pay | Admitting: Neurology

## 2016-08-02 NOTE — Telephone Encounter (Signed)
fyi

## 2016-08-02 NOTE — Telephone Encounter (Signed)
Pt called to let Dr. Carles Collet know that she cancelled her MRI because she can't afford the $2000 that she was going to have to pay.  The patient said if there is a good medical reason for having the MRI she would consider it, but she didn't know the reason she was having the MRI.

## 2016-08-02 NOTE — Telephone Encounter (Signed)
LMOM making patient aware to call if symptoms worsen.

## 2016-08-02 NOTE — Telephone Encounter (Signed)
I certainly understand the financial constraints.  I was doing the neck because she thought that it started after chiropractic treatment/trauma but we can wait.  The brain was b/c of hx of glioblastoma but that was as a kid so reasonable to wait but things get worse and she should let me know.

## 2016-08-03 ENCOUNTER — Other Ambulatory Visit: Payer: Self-pay | Admitting: Physician Assistant

## 2016-08-03 ENCOUNTER — Encounter: Payer: Self-pay | Admitting: Emergency Medicine

## 2016-08-03 ENCOUNTER — Ambulatory Visit (HOSPITAL_COMMUNITY): Admission: RE | Admit: 2016-08-03 | Payer: 59 | Source: Ambulatory Visit

## 2016-08-03 DIAGNOSIS — R8290 Unspecified abnormal findings in urine: Secondary | ICD-10-CM

## 2016-08-03 DIAGNOSIS — E781 Pure hyperglyceridemia: Secondary | ICD-10-CM

## 2016-08-03 MED ORDER — FISH OIL 1000 MG PO CAPS: 1.0000 | ORAL_CAPSULE | Freq: Every day | ORAL | 0 refills | Status: DC

## 2016-08-30 ENCOUNTER — Other Ambulatory Visit: Payer: Self-pay | Admitting: Family Medicine

## 2016-08-30 MED FILL — FLUoxetine HCL 20 MG CAPS: 20 | 90 days supply | Qty: 90 | Fill #0

## 2016-08-30 MED FILL — SYNTHROID 88 MCG TABLET: 88 | 90 days supply | Qty: 90 | Fill #0

## 2016-09-06 ENCOUNTER — Inpatient Hospital Stay: Admission: RE | Admit: 2016-09-06 | Payer: 59 | Source: Ambulatory Visit

## 2016-09-17 ENCOUNTER — Encounter: Payer: Self-pay | Admitting: Physician Assistant

## 2016-09-23 ENCOUNTER — Ambulatory Visit
Admission: RE | Admit: 2016-09-23 | Discharge: 2016-09-23 | Disposition: A | Discharge status: Home / Self Care | Payer: 59 | Source: Ambulatory Visit | Attending: Physician Assistant | Admitting: Physician Assistant

## 2016-09-23 DIAGNOSIS — Z1231 Encounter for screening mammogram for malignant neoplasm of breast: Secondary | ICD-10-CM | POA: Diagnosis not present

## 2016-09-23 DIAGNOSIS — Z1239 Encounter for other screening for malignant neoplasm of breast: Secondary | ICD-10-CM

## 2016-09-28 MED FILL — SIMVASTATIN 20 MG TABLET: 20 | 90 days supply | Qty: 90 | Fill #1

## 2016-10-21 MED FILL — LISINOPRIL-HCTZ 10-12.5 MG: 10-12.5 | 90 days supply | Qty: 45 | Fill #1

## 2016-11-30 ENCOUNTER — Other Ambulatory Visit: Payer: Self-pay | Admitting: Physician Assistant

## 2016-11-30 ENCOUNTER — Encounter: Payer: Self-pay | Admitting: Emergency Medicine

## 2016-11-30 MED FILL — FLUoxetine HCL 20 MG CAPS: 20 | 90 days supply | Qty: 90 | Fill #1

## 2016-11-30 MED FILL — SYNTHROID 88 MCG TABLET: 88 | 90 days supply | Qty: 90 | Fill #0

## 2016-12-27 ENCOUNTER — Other Ambulatory Visit: Payer: Self-pay | Admitting: Family Medicine

## 2016-12-27 MED FILL — SIMVASTATIN 20 MG TABLET: 20 | 90 days supply | Qty: 90 | Fill #0

## 2017-01-24 MED FILL — LISINOPRIL-HCTZ 10-12.5 MG: 10-12.5 | 90 days supply | Qty: 45 | Fill #0

## 2017-03-02 ENCOUNTER — Other Ambulatory Visit: Payer: Self-pay | Admitting: Physician Assistant

## 2017-03-02 MED FILL — SYNTHROID 88 MCG TABLET: 88 | 90 days supply | Qty: 90 | Fill #1

## 2017-03-05 NOTE — Progress Notes (Signed)
Patient presents to clinic today for follow-up of hypertension and major depressive disorder.  Hypertension -- Patient is currently on a regimen of lisinopril-HCTZ 10-12.5 mg, taking 1/2 tablet daily. Is taking as directed. Endorses good control of BP with this regimen. Patient denies chest pain, palpitations, lightheadedness, dizziness, vision changes or frequent headaches.  Depression -- Patient currently on a regimen of Fluoxetine 20 mg daily. Is taking as directed. Has family staying with her presently which has increased stress levels but she endorses managing well. Denies SI/HI. Is happy with current regimen..  Past Medical History:  Diagnosis Date  . Arthritis   . Asthma   . Cancer ([place])    Glioblastoma at 61 years old  . Thyroid disease    hypothyroidism    Current Outpatient Prescriptions on File Prior to Visit  Medication Sig Dispense Refill  . acetaminophen (TYLENOL) 500 MG tablet Take 500 mg by mouth every 6 (six) hours as needed.    Marland Kitchen albuterol (PROVENTIL HFA;VENTOLIN HFA) 108 (90 Base) MCG/ACT inhaler Inhale 2 puffs into the lungs every 4 (four) hours as needed for wheezing (cough, shortness of breath or wheezing.). 1 Inhaler 2  . b complex vitamins tablet Take 1 tablet by mouth daily.    . Omega-3 Fatty Acids (FISH OIL) 1000 MG CAPS Take 1 capsule (1,000 mg total) by mouth daily. 30 capsule 0  . simvastatin (ZOCOR) 20 MG tablet TAKE 1 TABLET BY MOUTH AT BEDTIME 90 tablet 0  . SYNTHROID 88 MCG tablet TAKE 1 TABLET BY MOUTH ONCE DAILY 90 tablet 1   No current facility-administered medications on file prior to visit.     No Known Allergies  Family History  Problem Relation Age of Onset  . COPD Mother   . Hypertension Father   . Dementia Father   . COPD Brother     Social History   Social History  . Marital status: Married    Spouse name: N/A  . Number of children: 3  . Years of education: N/A   Occupational History  . front desk     [employer]   Social History Main Topics  . Smoking status: Former Smoker    Packs/day: 1.00    Years: 10.00    Types: Cigarettes    Quit date: 11/22/1981  . Smokeless tobacco: Never Used  . Alcohol use No  . Drug use: No  . Sexual activity: Yes   Other Topics Concern  . None   Social History Narrative  . None   Review of Systems - See HPI.  All other ROS are negative.  BP 126/80   Pulse 72   Temp 98.2 F (36.8 C) (Oral)   Resp 14   Ht 5\' 3"  (1.6 m)   Wt 188 lb (85.3 kg)   SpO2 96%   BMI 33.30 kg/m   Physical Exam  Constitutional: She is oriented to person, place, and time and well-developed, well-nourished, and in no distress.  HENT:  Head: Normocephalic and atraumatic.  Eyes: Conjunctivae are normal.  Neck: Neck supple.  Cardiovascular: Normal rate, regular rhythm, normal heart sounds and intact distal pulses.   Pulmonary/Chest: Effort normal and breath sounds normal. No respiratory distress. She has no wheezes. She has no rales. She exhibits no tenderness.  Neurological: She is alert and oriented to person, place, and time.  Skin: Skin is warm and dry. No rash noted.  Psychiatric: Affect normal.  Vitals reviewed.  Assessment/Plan: Major depression in  remission ([place]) Doing very well with Fluoxetine 20 mg daily. Continue current regimen. Follow-up 6 months for CPE.  Essential hypertension BP normotensive. Asymptomatic. Continue current regimen. Repeat BMP today.    Leeanne Rio, PA-C

## 2017-03-07 ENCOUNTER — Ambulatory Visit (INDEPENDENT_AMBULATORY_CARE_PROVIDER_SITE_OTHER): Payer: 59 | Admitting: Physician Assistant

## 2017-03-07 ENCOUNTER — Encounter: Payer: Self-pay | Admitting: Physician Assistant

## 2017-03-07 VITALS — BP 126/80 | HR 72 | Temp 98.2°F | Resp 14 | Ht 63.0 in | Wt 188.0 lb

## 2017-03-07 DIAGNOSIS — I10 Essential (primary) hypertension: Secondary | ICD-10-CM | POA: Diagnosis not present

## 2017-03-07 DIAGNOSIS — F325 Major depressive disorder, single episode, in full remission: Secondary | ICD-10-CM

## 2017-03-07 LAB — BASIC METABOLIC PANEL
BUN: 17 mg/dL (ref 6–23)
CALCIUM: 9.7 mg/dL (ref 8.4–10.5)
CO2: 26 meq/L (ref 19–32)
Chloride: 105 mEq/L (ref 96–112)
Creatinine, Ser: 0.78 mg/dL (ref 0.40–1.20)
GFR: 79.5 mL/min (ref >60.00)
Glucose, Bld: 95 mg/dL (ref 70–99)
Potassium: 4.5 mEq/L (ref 3.5–5.1)
SODIUM: 140 meq/L (ref 135–145)

## 2017-03-07 MED ORDER — LISINOPRIL-HYDROCHLOROTHIAZIDE 10-12.5 MG PO TABS: 0.5000 | ORAL_TABLET | Freq: Every day | ORAL | 1 refills | Status: DC

## 2017-03-07 MED ORDER — FLUOXETINE HCL 20 MG PO CAPS: 20.0000 mg | ORAL_CAPSULE | Freq: Every day | ORAL | 1 refills | Status: DC

## 2017-03-07 MED FILL — FLUoxetine HCL 20 MG CAPS: 20 | 90 days supply | Qty: 90 | Fill #0

## 2017-03-07 NOTE — Progress Notes (Signed)
Pre visit review using our clinic review tool, if applicable. No additional management support is needed unless otherwise documented below in the visit note. 

## 2017-03-07 NOTE — Assessment & Plan Note (Signed)
Doing very well with Fluoxetine 20 mg daily. Continue current regimen. Follow-up 6 months for CPE.

## 2017-03-07 NOTE — Assessment & Plan Note (Signed)
BP normotensive. Asymptomatic. Continue current regimen. Repeat BMP today.

## 2017-03-07 NOTE — Patient Instructions (Signed)
Please go to the lab for blood work. I will call you with your results.   Continue medications as directed. Keep up with walking daily and eat a well-balanced diet.   Follow-up in 6 months for a complete physical.   DASH Eating Plan DASH stands for "Dietary Approaches to Stop Hypertension." The DASH eating plan is a healthy eating plan that has been shown to reduce high blood pressure (hypertension). It may also reduce your risk for type 2 diabetes, heart disease, and stroke. The DASH eating plan may also help with weight loss. What are tips for following this plan? General guidelines  Avoid eating more than 2,300 mg (milligrams) of salt (sodium) a day. If you have hypertension, you may need to reduce your sodium intake to 1,500 mg a day.  Limit alcohol intake to no more than 1 drink a day for nonpregnant women and 2 drinks a day for men. One drink equals 12 oz of beer, 5 oz of wine, or 1 oz of hard liquor.  Work with your health care provider to maintain a healthy body weight or to lose weight. Ask what an ideal weight is for you.  Get at least 30 minutes of exercise that causes your heart to beat faster (aerobic exercise) most days of the week. Activities may include walking, swimming, or biking.  Work with your health care provider or diet and nutrition specialist (dietitian) to adjust your eating plan to your individual calorie needs. Reading food labels  Check food labels for the amount of sodium per serving. Choose foods with less than 5 percent of the Daily Value of sodium. Generally, foods with less than 300 mg of sodium per serving fit into this eating plan.  To find whole grains, look for the word "whole" as the first word in the ingredient list. Shopping  Buy products labeled as "low-sodium" or "no salt added."  Buy fresh foods. Avoid canned foods and premade or frozen meals. Cooking  Avoid adding salt when cooking. Use salt-free seasonings or herbs instead of table  salt or sea salt. Check with your health care provider or pharmacist before using salt substitutes.  Do not fry foods. Cook foods using healthy methods such as baking, boiling, grilling, and broiling instead.  Cook with heart-healthy oils, such as olive, canola, soybean, or sunflower oil. Meal planning   Eat a balanced diet that includes: ? 5 or more servings of fruits and vegetables each day. At each meal, try to fill half of your plate with fruits and vegetables. ? Up to 6-8 servings of whole grains each day. ? Less than 6 oz of lean meat, poultry, or fish each day. A 3-oz serving of meat is about the same size as a deck of cards. One egg equals 1 oz. ? 2 servings of low-fat dairy each day. ? A serving of nuts, seeds, or beans 5 times each week. ? Heart-healthy fats. Healthy fats called Omega-3 fatty acids are found in foods such as flaxseeds and coldwater fish, like sardines, salmon, and mackerel.  Limit how much you eat of the following: ? Canned or prepackaged foods. ? Food that is high in trans fat, such as fried foods. ? Food that is high in saturated fat, such as fatty meat. ? Sweets, desserts, sugary drinks, and other foods with added sugar. ? Full-fat dairy products.  Do not salt foods before eating.  Try to eat at least 2 vegetarian meals each week.  Eat more home-cooked food and less restaurant,  buffet, and fast food.  When eating at a restaurant, ask that your food be prepared with less salt or no salt, if possible. What foods are recommended? The items listed may not be a complete list. Talk with your dietitian about what dietary choices are best for you. Grains Whole-grain or whole-wheat bread. Whole-grain or whole-wheat pasta. Brown rice. [place]. Bulgur. Whole-grain and low-sodium cereals. Pita bread. Low-fat, low-sodium crackers. Whole-wheat flour tortillas. Vegetables Fresh or frozen vegetables (raw, steamed, roasted, or grilled). Low-sodium or  reduced-sodium tomato and vegetable juice. Low-sodium or reduced-sodium tomato sauce and tomato paste. Low-sodium or reduced-sodium canned vegetables. Fruits All fresh, dried, or frozen fruit. Canned fruit in natural juice (without added sugar). Meat and other protein foods Skinless chicken or [place]. Ground chicken or [place]. Pork with fat trimmed off. Fish and seafood. Egg whites. Dried beans, peas, or lentils. Unsalted nuts, nut butters, and seeds. Unsalted canned beans. Lean cuts of beef with fat trimmed off. Low-sodium, lean deli meat. Dairy Low-fat (1%) or fat-free (skim) milk. Fat-free, low-fat, or reduced-fat cheeses. Nonfat, low-sodium ricotta or cottage cheese. Low-fat or nonfat yogurt. Low-fat, low-sodium cheese. Fats and oils Soft margarine without trans fats. Vegetable oil. Low-fat, reduced-fat, or light mayonnaise and salad dressings (reduced-sodium). Canola, safflower, olive, soybean, and sunflower oils. Avocado. Seasoning and other foods Herbs. Spices. Seasoning mixes without salt. Unsalted popcorn and pretzels. Fat-free sweets. What foods are not recommended? The items listed may not be a complete list. Talk with your dietitian about what dietary choices are best for you. Grains Baked goods made with fat, such as croissants, muffins, or some breads. Dry pasta or rice meal packs. Vegetables Creamed or fried vegetables. Vegetables in a cheese sauce. Regular canned vegetables (not low-sodium or reduced-sodium). Regular canned tomato sauce and paste (not low-sodium or reduced-sodium). Regular tomato and vegetable juice (not low-sodium or reduced-sodium). Angie Fava. Olives. Fruits Canned fruit in a light or heavy syrup. Fried fruit. Fruit in cream or butter sauce. Meat and other protein foods Fatty cuts of meat. Ribs. Fried meat. Berniece Salines. Sausage. Bologna and other processed lunch meats. Salami. Fatback. Hotdogs. Bratwurst. Salted nuts and seeds. Canned beans with added salt. Canned or  smoked fish. Whole eggs or egg yolks. Chicken or [place] with skin. Dairy Whole or 2% milk, cream, and half-and-half. Whole or full-fat cream cheese. Whole-fat or sweetened yogurt. Full-fat cheese. Nondairy creamers. Whipped toppings. Processed cheese and cheese spreads. Fats and oils Butter. Stick margarine. Lard. Shortening. Ghee. Bacon fat. Tropical oils, such as coconut, palm kernel, or palm oil. Seasoning and other foods Salted popcorn and pretzels. Onion salt, garlic salt, seasoned salt, table salt, and sea salt. Worcestershire sauce. Tartar sauce. Barbecue sauce. Teriyaki sauce. Soy sauce, including reduced-sodium. Steak sauce. Canned and packaged gravies. Fish sauce. Oyster sauce. Cocktail sauce. Horseradish that you find on the shelf. Ketchup. Mustard. Meat flavorings and tenderizers. Bouillon cubes. Hot sauce and Tabasco sauce. Premade or packaged marinades. Premade or packaged taco seasonings. Relishes. Regular salad dressings. Where to find more information:  National Heart, Lung, and [place]: https://wilson-eaton.com/  American Heart Association: www.heart.org Summary  The DASH eating plan is a healthy eating plan that has been shown to reduce high blood pressure (hypertension). It may also reduce your risk for type 2 diabetes, heart disease, and stroke.  With the DASH eating plan, you should limit salt (sodium) intake to 2,300 mg a day. If you have hypertension, you may need to reduce your sodium intake to 1,500 mg a day.  When  on the DASH eating plan, aim to eat more fresh fruits and vegetables, whole grains, lean proteins, low-fat dairy, and heart-healthy fats.  Work with your health care provider or diet and nutrition specialist (dietitian) to adjust your eating plan to your individual calorie needs. This information is not intended to replace advice given to you by your health care provider. Make sure you discuss any questions you have with your health care provider. Document  Released: 05/27/2011 Document Revised: 05/31/2016 Document Reviewed: 05/31/2016 Elsevier Interactive Patient Education  2017 [employer].

## 2017-04-01 ENCOUNTER — Encounter: Payer: Self-pay | Admitting: Physician Assistant

## 2017-04-10 ENCOUNTER — Emergency Department (HOSPITAL_COMMUNITY)
Admission: EM | Admit: 2017-04-10 | Discharge: 2017-04-10 | Disposition: A | Discharge status: Home / Self Care | Payer: 59 | Attending: Emergency Medicine | Admitting: Emergency Medicine

## 2017-04-10 ENCOUNTER — Emergency Department (HOSPITAL_COMMUNITY): Payer: 59

## 2017-04-10 ENCOUNTER — Encounter (HOSPITAL_COMMUNITY): Payer: Self-pay | Admitting: Emergency Medicine

## 2017-04-10 DIAGNOSIS — J45909 Unspecified asthma, uncomplicated: Secondary | ICD-10-CM | POA: Insufficient documentation

## 2017-04-10 DIAGNOSIS — Z85841 Personal history of malignant neoplasm of brain: Secondary | ICD-10-CM | POA: Insufficient documentation

## 2017-04-10 DIAGNOSIS — R079 Chest pain, unspecified: Secondary | ICD-10-CM | POA: Diagnosis not present

## 2017-04-10 DIAGNOSIS — I10 Essential (primary) hypertension: Secondary | ICD-10-CM | POA: Diagnosis not present

## 2017-04-10 DIAGNOSIS — Z87891 Personal history of nicotine dependence: Secondary | ICD-10-CM | POA: Diagnosis not present

## 2017-04-10 DIAGNOSIS — R1011 Right upper quadrant pain: Secondary | ICD-10-CM | POA: Diagnosis not present

## 2017-04-10 DIAGNOSIS — R109 Unspecified abdominal pain: Secondary | ICD-10-CM

## 2017-04-10 DIAGNOSIS — E039 Hypothyroidism, unspecified: Secondary | ICD-10-CM | POA: Diagnosis not present

## 2017-04-10 DIAGNOSIS — Z79899 Other long term (current) drug therapy: Secondary | ICD-10-CM | POA: Insufficient documentation

## 2017-04-10 LAB — COMPREHENSIVE METABOLIC PANEL
ALT: 23 U/L (ref 14–54)
ANION GAP: 11 (ref 5–15)
AST: 21 U/L (ref 15–41)
Albumin: 4.3 g/dL (ref 3.5–5.0)
Alkaline Phosphatase: 69 U/L (ref 38–126)
BILIRUBIN TOTAL: 0.7 mg/dL (ref 0.3–1.2)
BUN: 19 mg/dL (ref 6–20)
CHLORIDE: 105 mmol/L (ref 101–111)
CO2: 22 mmol/L (ref 22–32)
Calcium: 9.4 mg/dL (ref 8.9–10.3)
Creatinine, Ser: 0.85 mg/dL (ref 0.44–1.00)
Glucose, Bld: 140 mg/dL — ABNORMAL HIGH (ref 65–99)
POTASSIUM: 3.6 mmol/L (ref 3.5–5.1)
Sodium: 138 mmol/L (ref 135–145)
TOTAL PROTEIN: 7.4 g/dL (ref 6.5–8.1)

## 2017-04-10 LAB — CBC
HCT: 38.7 % (ref 36.0–46.0)
Hemoglobin: 12.8 g/dL (ref 12.0–15.0)
MCH: 27.2 pg (ref 26.0–34.0)
MCHC: 33.1 g/dL (ref 30.0–36.0)
MCV: 82.2 fL (ref 78.0–100.0)
PLATELETS: 272 10*3/uL (ref 150–400)
RBC: 4.71 MIL/uL (ref 3.87–5.11)
RDW: 14.7 % (ref 11.5–15.5)
WBC: 9.6 10*3/uL (ref 4.0–10.5)

## 2017-04-10 LAB — URINALYSIS, ROUTINE W REFLEX MICROSCOPIC
BILIRUBIN URINE: NEGATIVE
Glucose, UA: NEGATIVE mg/dL
Hgb urine dipstick: NEGATIVE
Ketones, ur: 5 mg/dL — AB
Leukocytes, UA: NEGATIVE
NITRITE: NEGATIVE
Protein, ur: 30 mg/dL — AB
SPECIFIC GRAVITY, URINE: 1.024 (ref 1.005–1.030)
pH: 5 (ref 5.0–8.0)

## 2017-04-10 LAB — I-STAT TROPONIN, ED: TROPONIN I, POC: 0 ng/mL (ref 0.00–0.08)

## 2017-04-10 LAB — D-DIMER, QUANTITATIVE (NOT AT ARMC): D DIMER QUANT: 0.39 ug{FEU}/mL (ref 0.00–0.50)

## 2017-04-10 LAB — LIPASE, BLOOD: LIPASE: 34 U/L (ref 11–51)

## 2017-04-10 MED ORDER — MORPHINE SULFATE (PF) 4 MG/ML IV SOLN
4.0000 mg | Freq: Once | INTRAVENOUS | Status: AC
  Administered 2017-04-10: 4 mg via INTRAVENOUS
  Filled 2017-04-10: qty 1

## 2017-04-10 MED ORDER — SODIUM CHLORIDE 0.9 % IV BOLUS (SEPSIS)
1000.0000 mL | Freq: Once | INTRAVENOUS | Status: AC
  Administered 2017-04-10: 1000 mL via INTRAVENOUS

## 2017-04-10 NOTE — ED Provider Notes (Signed)
[facility] DEPT Provider Note   CSN: [id number] Arrival date & time: 04/10/17  1523     History   Chief Complaint Chief Complaint  Patient presents with  . Abdominal Pain  . Shoulder Pain    HPI Heidi Christensen is a 61 y.o. female.  HPI   61 year old female with hx of HTN, thyroid disease, asthma, remote glioblastoma here with abd pain.  Pt report nausea yesterday.  This AM she developed acute onset of sharp pain to R flank that radiates to her R shoulder.  Pain worse with inspiration and with laying flat.  Report SOB when pain is intense.  Pain has since eased off.  She did report feeling lightheadedness, and clammy when pain is intense.  Denies fever, chills, chest pain, constipation, diarrhea, dysuria, hematuria. She has never had pain like this before.  Denies prior hx of PE/DVT, no recent surgery, prolonged bed rest, unilateral leg swelling or calf pain.  Denies any significant cardiac hx.  Report prior cholecystectomy. Denies abd pain.  No prior hx of kidney stone.      Past Medical History:  Diagnosis Date  . Arthritis   . Asthma   . Cancer ([place])    Glioblastoma at 61 years old  . Thyroid disease    hypothyroidism    Patient Active Problem List   Diagnosis Date Noted  . Visit for preventive health examination 07/24/2016  . Cervical cancer screening 07/24/2016  . Breast cancer screening 07/24/2016  . Tremor 07/24/2016  . Major depression in remission ([place]) 03/11/2016  . BMI 32.0-32.9,adult 03/11/2016  . Asthma, mild intermittent 03/11/2016  . Cervical disc disease 10/16/2013  . Essential hypertension 01/20/2012  . Hypothyroidism 11/12/2008  . Hyperlipidemia 11/12/2008    Past Surgical History:  Procedure Laterality Date  . BRAIN SURGERY  61 yo  . CHOLECYSTECTOMY  1998  . EXPLORATORY LAPAROTOMY  1983    OB History    No data available       Home Medications    Prior to Admission medications   Medication Sig Start  Date End Date Taking? Authorizing Provider  acetaminophen (TYLENOL) 500 MG tablet Take 500 mg by mouth every 6 (six) hours as needed.   Yes [provider]  albuterol (PROVENTIL HFA;VENTOLIN HFA) 108 (90 Base) MCG/ACT inhaler Inhale 2 puffs into the lungs every 4 (four) hours as needed for wheezing (cough, shortness of breath or wheezing.). 03/11/16  Yes [place], Betty G, MD  b complex vitamins tablet Take 1 tablet by mouth daily.   Yes [provider]  FLUoxetine (PROZAC) 20 MG capsule Take 1 capsule (20 mg total) by mouth daily. 03/07/17  Yes Brunetta Jeans, PA-C  lisinopril-hydrochlorothiazide (PRINZIDE,ZESTORETIC) 10-12.5 MG tablet Take 0.5 tablets by mouth daily. 03/07/17  Yes Brunetta Jeans, PA-C  Omega-3 Fatty Acids (FISH OIL) 1000 MG CAPS Take 1 capsule (1,000 mg total) by mouth daily. 08/03/16  Yes Brunetta Jeans, PA-C  simvastatin (ZOCOR) 20 MG tablet TAKE 1 TABLET BY MOUTH AT BEDTIME 12/27/16  Yes Brunetta Jeans, PA-C  SYNTHROID 88 MCG tablet TAKE 1 TABLET BY MOUTH ONCE DAILY 11/30/16  Yes Brunetta Jeans, PA-C    Family History Family History  Problem Relation Age of Onset  . COPD Mother   . Hypertension Father   . Dementia Father   . COPD Brother     Social History Social History  Substance Use Topics  . Smoking status: Former Smoker  Packs/day: 1.00    Years: 10.00    Types: Cigarettes    Quit date: 11/22/1981  . Smokeless tobacco: Never Used  . Alcohol use No     Allergies   Patient has no known allergies.   Review of Systems Review of Systems  All other systems reviewed and are negative.    Physical Exam Updated Vital Signs BP 111/70   Pulse 77   Temp 97.7 F (36.5 C) (Oral)   Resp 16   SpO2 94%   Physical Exam  Constitutional: She is oriented to person, place, and time. She appears well-developed and well-nourished. No distress.  HENT:  Head: Atraumatic.  Mouth/Throat: Oropharynx is clear and moist.  Eyes: Conjunctivae  are normal.  Neck: Neck supple.  Cardiovascular: Normal rate, regular rhythm and intact distal pulses.   Pulmonary/Chest: Effort normal and breath sounds normal. No respiratory distress. She has no wheezes. She has no rales. She exhibits no tenderness.  Abdominal: Soft. Bowel sounds are normal. She exhibits no distension and no mass. There is tenderness (mild tenderness to Right upper lateral abdomen without CVA tenderness, no rash). There is no guarding.  Musculoskeletal: She exhibits no edema.  Neurological: She is alert and oriented to person, place, and time.  Skin: No rash noted.  Psychiatric: She has a normal mood and affect.  Nursing note and vitals reviewed.    ED Treatments / Results  Labs (all labs ordered are listed, but only abnormal results are displayed) Labs Reviewed  COMPREHENSIVE METABOLIC PANEL - Abnormal; Notable for the following:       Result Value   Glucose, Bld 140 (*)    All other components within normal limits  URINALYSIS, ROUTINE W REFLEX MICROSCOPIC - Abnormal; Notable for the following:    Color, Urine AMBER (*)    APPearance HAZY (*)    Ketones, ur 5 (*)    Protein, ur 30 (*)    Bacteria, UA RARE (*)    Squamous Epithelial / LPF 0-5 (*)    All other components within normal limits  LIPASE, BLOOD  CBC  D-DIMER, QUANTITATIVE (NOT AT [facility])  I-STAT TROPONIN, ED    EKG  EKG Interpretation None      Date: 04/10/2017  Rate: 80  Rhythm: normal sinus rhythm  QRS Axis: right axis deviation  Intervals: normal  ST/T Wave abnormalities: normal  Conduction Disutrbances: none  Narrative Interpretation: low voltage  Old EKG Reviewed: No significant changes noted     Radiology Dg Chest 2 View  Result Date: 04/10/2017 CLINICAL DATA:  Pleuritic chest pain, right upper quadrant and shoulder pain for the past 2 hours. EXAM: CHEST  2 VIEW COMPARISON:  None. FINDINGS: The heart size and mediastinal contours are within normal limits. Subsegmental  atelectasis and/or scarring is seen at each lung base. No pneumonic consolidation, effusion or pneumothorax. Mild multilevel degenerative disc disease. No acute nor suspicious osseous abnormality. Cholecystectomy clips are seen in the right upper quadrant. IMPRESSION: Bibasilar atelectasis and/or scarring.  No active pulmonary disease. Electronically Signed   By: Ashley Royalty M.D.   On: 04/10/2017 19:30    Procedures Procedures (including critical care time)  Medications Ordered in ED Medications  morphine 4 MG/ML injection 4 mg (4 mg Intravenous Given 04/10/17 1806)  sodium chloride 0.9 % bolus 1,000 mL (0 mLs Intravenous Stopped 04/10/17 1928)     Initial Impression / Assessment and Plan / ED Course  I have reviewed the triage vital signs and the nursing notes.  Pertinent labs & imaging results that were available during my care of the patient were reviewed by me and considered in my medical decision making (see chart for details).     BP 124/72 (BP Location: Left Arm)   Pulse 70   Temp 97.7 F (36.5 C) (Oral)   Resp 17   SpO2 100%    Final Clinical Impressions(s) / ED Diagnoses   Final diagnoses:  Right flank pain    New Prescriptions New Prescriptions   No medications on file   6:28 PM Patient presents with acute onset of pain to her right upper abdomen right flank area. Mild discomfort on palpation. She does not have any significant other abdominal discomfort and her lung sounds clear. She does complain of some pleuritic pain with breathing. Remote history of pleurisy. Will obtain chest x-ray.  EKG without acute ischemic changes, urine shows no evidence of UTI and no blood in urine to suggest kidney stones, normal lipase, her labs are reassuring, vital signs stable. A d-dimer was obtained and is negative therefore no suspicion for PE causing her symptoms.  7:49 PM On reexamination, patient is resting comfortably. Rhythm of abdomen reveals no significant abdominal  discomfort. For vital signs stable, no hypoxia, and she is in no acute distress. I did discuss additional evaluation including CT scans however given the improvement of symptoms and patient feeling well, we agrees to discharge patient. She understands return if her condition worsen. She will follow-up with her primary care provider for further care.   Domenic Moras, PA-C 04/10/17 1950    Tanna Furry, MD 04/29/17 [phone]

## 2017-04-10 NOTE — ED Triage Notes (Signed)
Pt reports RUQ pain and R shoulder pain for the past 2 hours. No injury. Denies CP or SOB. Abd pain worse with movement. NO emesis or diarrhea.

## 2017-04-10 NOTE — Discharge Instructions (Signed)
Please follow up with your doctor for further evaluation of your condition.  Return if your condition worsen or if you have other concerns.

## 2017-04-13 ENCOUNTER — Other Ambulatory Visit: Payer: Self-pay | Admitting: Physician Assistant

## 2017-04-13 ENCOUNTER — Encounter: Payer: Self-pay | Admitting: Physician Assistant

## 2017-04-13 MED FILL — SIMVASTATIN 20 MG TABLET: 20 | 90 days supply | Qty: 90 | Fill #0

## 2017-04-20 MED FILL — LISINOPRIL-HCTZ 10-12.5 MG: 10-12.5 | 90 days supply | Qty: 45 | Fill #1

## 2017-06-06 ENCOUNTER — Other Ambulatory Visit: Payer: Self-pay | Admitting: Physician Assistant

## 2017-06-06 MED FILL — SYNTHROID 88 MCG TABLET: 88 | 30 days supply | Qty: 30 | Fill #0

## 2017-06-06 MED FILL — FLUoxetine HCL 20 MG CAPS: 20 | 90 days supply | Qty: 90 | Fill #1

## 2017-06-17 ENCOUNTER — Encounter: Payer: Self-pay | Admitting: Physician Assistant

## 2017-06-22 ENCOUNTER — Telehealth: Payer: Self-pay | Admitting: *Deleted

## 2017-06-22 NOTE — Telephone Encounter (Signed)
LM for patient to call to schedule appointment.

## 2017-06-22 NOTE — Telephone Encounter (Signed)
Is patient in need of labs?  According to her last labs with you, she was just told to follow-up in 6 months with CPE.   Copied from [place] [phone]. Topic: Appointment Scheduling - Scheduling Inquiry for Clinic >> Jun 22, 2017  2:43 PM Arletha Grippe wrote: Reason for CRM: pt needs to have labs due to her prozac rx.please call work number to schedule at [phone].

## 2017-06-22 NOTE — Telephone Encounter (Signed)
No labs needed for Prozac. She needs OV.

## 2017-06-24 ENCOUNTER — Encounter: Payer: Self-pay | Admitting: Physician Assistant

## 2017-06-25 MED ORDER — FLUOXETINE HCL 20 MG PO CAPS: 20.0000 mg | ORAL_CAPSULE | Freq: Every day | ORAL | 0 refills | Status: DC

## 2017-07-13 ENCOUNTER — Other Ambulatory Visit: Payer: Self-pay | Admitting: Physician Assistant

## 2017-07-13 MED FILL — LISINOPRIL-HCTZ 10-12.5 MG: 10-12.5 | 90 days supply | Qty: 45 | Fill #0

## 2017-07-18 ENCOUNTER — Other Ambulatory Visit: Payer: Self-pay | Admitting: Physician Assistant

## 2017-07-18 MED FILL — SYNTHROID 88 MCG TABLET: 88 | 30 days supply | Qty: 30 | Fill #0

## 2017-07-19 ENCOUNTER — Other Ambulatory Visit: Payer: Self-pay | Admitting: Physician Assistant

## 2017-07-19 MED FILL — SIMVASTATIN 20 MG TABS: 20 | 30 days supply | Qty: 30 | Fill #0

## 2017-07-28 ENCOUNTER — Other Ambulatory Visit: Payer: Self-pay

## 2017-07-28 ENCOUNTER — Ambulatory Visit (INDEPENDENT_AMBULATORY_CARE_PROVIDER_SITE_OTHER): Payer: No Typology Code available for payment source | Admitting: Physician Assistant

## 2017-07-28 ENCOUNTER — Encounter: Payer: Self-pay | Admitting: Physician Assistant

## 2017-07-28 VITALS — BP 110/80 | HR 66 | Temp 98.2°F | Resp 16 | Ht 63.0 in | Wt 187.0 lb

## 2017-07-28 DIAGNOSIS — F325 Major depressive disorder, single episode, in full remission: Secondary | ICD-10-CM

## 2017-07-28 DIAGNOSIS — E785 Hyperlipidemia, unspecified: Secondary | ICD-10-CM

## 2017-07-28 DIAGNOSIS — Z0000 Encounter for general adult medical examination without abnormal findings: Secondary | ICD-10-CM

## 2017-07-28 DIAGNOSIS — E039 Hypothyroidism, unspecified: Secondary | ICD-10-CM | POA: Diagnosis not present

## 2017-07-28 DIAGNOSIS — I10 Essential (primary) hypertension: Secondary | ICD-10-CM

## 2017-07-28 LAB — CBC WITH DIFFERENTIAL/PLATELET
BASOS ABS: 0 10*3/uL (ref 0.0–0.1)
Basophils Relative: 0.6 % (ref 0.0–3.0)
EOS ABS: 0.1 10*3/uL (ref 0.0–0.7)
Eosinophils Relative: 1.3 % (ref 0.0–5.0)
HCT: 40.9 % (ref 36.0–46.0)
Hemoglobin: 13.4 g/dL (ref 12.0–15.0)
LYMPHS ABS: 3.2 10*3/uL (ref 0.7–4.0)
LYMPHS PCT: 42.6 % (ref 12.0–46.0)
MCHC: 32.9 g/dL (ref 30.0–36.0)
MCV: 82.1 fl (ref 78.0–100.0)
MONOS PCT: 4.1 % (ref 3.0–12.0)
Monocytes Absolute: 0.3 10*3/uL (ref 0.1–1.0)
NEUTROS PCT: 51.4 % (ref 43.0–77.0)
Neutro Abs: 3.8 10*3/uL (ref 1.4–7.7)
Platelets: 338 10*3/uL (ref 150.0–400.0)
RBC: 4.98 Mil/uL (ref 3.87–5.11)
RDW: 15.1 % (ref 11.5–15.5)
WBC: 7.5 10*3/uL (ref 4.0–10.5)

## 2017-07-28 LAB — COMPREHENSIVE METABOLIC PANEL
ALK PHOS: 73 U/L (ref 39–117)
ALT: 16 U/L (ref 0–35)
AST: 12 U/L (ref 0–37)
Albumin: 4.6 g/dL (ref 3.5–5.2)
BILIRUBIN TOTAL: 0.4 mg/dL (ref 0.2–1.2)
BUN: 20 mg/dL (ref 6–23)
CO2: 28 mEq/L (ref 19–32)
CREATININE: 0.79 mg/dL (ref 0.40–1.20)
Calcium: 9.5 mg/dL (ref 8.4–10.5)
Chloride: 105 mEq/L (ref 96–112)
GFR: 78.24 mL/min (ref >60.00)
GLUCOSE: 96 mg/dL (ref 70–99)
Potassium: 4.7 mEq/L (ref 3.5–5.1)
Sodium: 139 mEq/L (ref 135–145)
TOTAL PROTEIN: 7.1 g/dL (ref 6.0–8.3)

## 2017-07-28 LAB — LIPID PANEL
CHOL/HDL RATIO: 3
Cholesterol: 152 mg/dL (ref 0–200)
HDL: 46.5 mg/dL (ref >39.00)
LDL Cholesterol: 66 mg/dL (ref 0–99)
NONHDL: 105.62
Triglycerides: 199 mg/dL — ABNORMAL HIGH (ref 0.0–149.0)
VLDL: 39.8 mg/dL (ref 0.0–40.0)

## 2017-07-28 LAB — TSH: TSH: 4.18 u[IU]/mL (ref 0.35–4.50)

## 2017-07-28 LAB — HEMOGLOBIN A1C: HEMOGLOBIN A1C: 6.1 % (ref 4.6–6.5)

## 2017-07-28 MED ORDER — FLUOXETINE HCL 40 MG PO CAPS: 40.0000 mg | ORAL_CAPSULE | Freq: Every day | ORAL | 2 refills | Status: DC

## 2017-07-28 MED FILL — FLUoxetine HCL 40 MG CAPS: 40 | 30 days supply | Qty: 30 | Fill #0

## 2017-07-28 NOTE — Patient Instructions (Signed)
Please go to the lab for blood work.   Our office will call you with your results unless you have chosen to receive results via MyChart.  If your blood work is normal we will follow-up each year for physicals and as scheduled for chronic medical problems.  If anything is abnormal we will treat accordingly and get you in for a follow-up.  Please start the new dose of Prozac daily. Please keep me up-to-date on mood. If not improving, please let me know.  Otherwise follow-up in 6 months.    Preventive Care 40-64 Years, Female Preventive care refers to lifestyle choices and visits with your health care provider that can promote health and wellness. What does preventive care include?  A yearly physical exam. This is also called an annual well check.  Dental exams once or twice a year.  Routine eye exams. Ask your health care provider how often you should have your eyes checked.  Personal lifestyle choices, including: ? Daily care of your teeth and gums. ? Regular physical activity. ? Eating a healthy diet. ? Avoiding tobacco and drug use. ? Limiting alcohol use. ? Practicing safe sex. ? Taking low-dose aspirin daily starting at age 107. ? Taking vitamin and mineral supplements as recommended by your health care provider. What happens during an annual well check? The services and screenings done by your health care provider during your annual well check will depend on your age, overall health, lifestyle risk factors, and family history of disease. Counseling Your health care provider may ask you questions about your:  Alcohol use.  Tobacco use.  Drug use.  Emotional well-being.  Home and relationship well-being.  Sexual activity.  Eating habits.  Work and work Statistician.  Method of birth control.  Menstrual cycle.  Pregnancy history.  Screening You may have the following tests or measurements:  Height, weight, and BMI.  Blood pressure.  Lipid and  cholesterol levels. These may be checked every 5 years, or more frequently if you are over 108 years old.  Skin check.  Lung cancer screening. You may have this screening every year starting at age 70 if you have a 30-pack-year history of smoking and currently smoke or have quit within the past 15 years.  Fecal occult blood test (FOBT) of the stool. You may have this test every year starting at age 34.  Flexible sigmoidoscopy or colonoscopy. You may have a sigmoidoscopy every 5 years or a colonoscopy every 10 years starting at age 67.  Hepatitis C blood test.  Hepatitis B blood test.  Sexually transmitted disease (STD) testing.  Diabetes screening. This is done by checking your blood sugar (glucose) after you have not eaten for a while (fasting). You may have this done every 1-3 years.  Mammogram. This may be done every 1-2 years. Talk to your health care provider about when you should start having regular mammograms. This may depend on whether you have a family history of breast cancer.  BRCA-related cancer screening. This may be done if you have a family history of breast, ovarian, tubal, or peritoneal cancers.  Pelvic exam and Pap test. This may be done every 3 years starting at age 11. Starting at age 32, this may be done every 5 years if you have a Pap test in combination with an HPV test.  Bone density scan. This is done to screen for osteoporosis. You may have this scan if you are at high risk for osteoporosis.  Discuss your test results, treatment  options, and if necessary, the need for more tests with your health care provider. Vaccines Your health care provider may recommend certain vaccines, such as:  Influenza vaccine. This is recommended every year.  Tetanus, diphtheria, and acellular pertussis (Tdap, Td) vaccine. You may need a Td booster every 10 years.  Varicella vaccine. You may need this if you have not been vaccinated.  Zoster vaccine. You may need this after age  86.  Measles, mumps, and rubella (MMR) vaccine. You may need at least one dose of MMR if you were born in 1957 or later. You may also need a second dose.  Pneumococcal 13-valent conjugate (PCV13) vaccine. You may need this if you have certain conditions and were not previously vaccinated.  Pneumococcal polysaccharide (PPSV23) vaccine. You may need one or two doses if you smoke cigarettes or if you have certain conditions.  Meningococcal vaccine. You may need this if you have certain conditions.  Hepatitis A vaccine. You may need this if you have certain conditions or if you travel or work in places where you may be exposed to hepatitis A.  Hepatitis B vaccine. You may need this if you have certain conditions or if you travel or work in places where you may be exposed to hepatitis B.  Haemophilus influenzae type b (Hib) vaccine. You may need this if you have certain conditions.  Talk to your health care provider about which screenings and vaccines you need and how often you need them. This information is not intended to replace advice given to you by your health care provider. Make sure you discuss any questions you have with your health care provider. Document Released: 07/04/2015 Document Revised: 02/25/2016 Document Reviewed: 04/08/2015 Elsevier Interactive Patient Education  [employer].

## 2017-07-28 NOTE — Progress Notes (Signed)
Patient presents to clinic today for annual exam.  Patient is fasting for labs. Patient with history of anxiety/depression, currently on Prozac 20 mg daily. 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 Notes significant stressors with depressed mood and anxiety. Denies SI/HI. Would like to consider increase in her dose of SSRI.  Health Maintenance: Immunizations --Flu and tetanus up-to-date. Colonoscopy -- up-to-date Mammogram -- up-to-date PAP -- up-to-date.  Past Medical History:  Diagnosis Date  . Arthritis   . Asthma   . Cancer ([place])    Glioblastoma at 62 years old  . Thyroid disease    hypothyroidism    Past Surgical History:  Procedure Laterality Date  . BRAIN SURGERY  62 yo  . CHOLECYSTECTOMY  1998  . EXPLORATORY LAPAROTOMY  1983    Current Outpatient Medications on File Prior to Visit  Medication Sig Dispense Refill  . acetaminophen (TYLENOL) 500 MG tablet Take 500 mg by mouth every 6 (six) hours as needed.    Marland Kitchen albuterol (PROVENTIL HFA;VENTOLIN HFA) 108 (90 Base) MCG/ACT inhaler Inhale 2 puffs into the lungs every 4 (four) hours as needed for wheezing (cough, shortness of breath or wheezing.). 1 Inhaler 2  . b complex vitamins tablet Take 1 tablet by mouth daily.    Marland Kitchen lisinopril-hydrochlorothiazide (PRINZIDE,ZESTORETIC) 10-12.5 MG tablet Take 0.5 tablets by mouth daily. 45 tablet 1  . Omega-3 Fatty Acids (FISH OIL) 1000 MG CAPS Take 1 capsule (1,000 mg total) by mouth daily. 30 capsule 0  . simvastatin (ZOCOR) 20 MG tablet TAKE 1 TABLET BY MOUTH AT BEDTIME 30 tablet 0  . SYNTHROID 88 MCG tablet TAKE 1 TABLET BY MOUTH ONCE DAILY. *MUST HAVE APPOINTMENT AND TESTING FOR MORE REFILLS* 30 tablet 0   No current facility-administered medications on file prior to visit.     No Known Allergies  Family History  Problem Relation Age of Onset  . COPD Mother   . Hypertension Father   .  Dementia Father   . COPD Brother     Social History   Socioeconomic History  . Marital status: Married    Spouse name: Not on file  . Number of children: 3  . Years of education: Not on file  . Highest education level: Not on file  Social Needs  . Financial resource strain: Not on file  . Food insecurity - worry: Not on file  . Food insecurity - inability: Not on file  . Transportation needs - medical: Not on file  . Transportation needs - non-medical: Not on file  Occupational History  . Occupation: front desk    Comment: [employer]  Tobacco Use  . Smoking status: Former Smoker    Packs/day: 1.00    Years: 10.00    Pack years: 10.00    Types: Cigarettes    Last attempt to quit: 11/22/1981    Years since quitting: 35.7  . Smokeless tobacco: Never Used  Substance and Sexual Activity  . Alcohol use: No  . Drug use: No  . Sexual activity: Yes  Other Topics Concern  . Not on file  Social History Narrative  . Not on file   Review of Systems  Constitutional: Negative for fever and weight loss.  HENT: Negative for ear discharge, ear pain, hearing loss and tinnitus.   Eyes: Negative for blurred vision, double vision, photophobia and pain.  Respiratory: Negative for cough and shortness  of breath.   Cardiovascular: Negative for chest pain and palpitations.  Gastrointestinal: Negative for abdominal pain, blood in stool, constipation, diarrhea, heartburn, melena, nausea and vomiting.  Genitourinary: Negative for dysuria, flank pain, frequency, hematuria and urgency.  Musculoskeletal: Negative for falls.  Neurological: Negative for dizziness, loss of consciousness and headaches.  Endo/Heme/Allergies: Negative for environmental allergies.  Psychiatric/Behavioral: Negative for depression, hallucinations, substance abuse and suicidal ideas. The patient is nervous/anxious. The patient does not have insomnia.    BP 110/80   Pulse 66   Temp 98.2 F (36.8  C) (Oral)   Resp 16   Ht 5\' 3"  (1.6 m)   Wt 187 lb (84.8 kg)   SpO2 98%   BMI 33.13 kg/m   Physical Exam  Constitutional: She is oriented to person, place, and time and well-developed, well-nourished, and in no distress.  HENT:  Head: Normocephalic and atraumatic.  Right Ear: Tympanic membrane, external ear and ear canal normal.  Left Ear: Tympanic membrane, external ear and ear canal normal.  Nose: Nose normal. No mucosal edema.  Mouth/Throat: Uvula is midline, oropharynx is clear and moist and mucous membranes are normal. No oropharyngeal exudate or posterior oropharyngeal erythema.  Eyes: Conjunctivae are normal. Pupils are equal, round, and reactive to light.  Neck: Neck supple. No thyromegaly present.  Cardiovascular: Normal rate, regular rhythm, normal heart sounds and intact distal pulses.  Pulmonary/Chest: Effort normal and breath sounds normal. No respiratory distress. She has no wheezes. She has no rales.  Abdominal: Soft. Bowel sounds are normal. She exhibits no distension and no mass. There is no tenderness. There is no rebound and no guarding.  Lymphadenopathy:    She has no cervical adenopathy.  Neurological: She is alert and oriented to person, place, and time. No cranial nerve deficit.  Skin: Skin is warm and dry. No rash noted.  Psychiatric: Affect normal.  Vitals reviewed.  Assessment/Plan: Essential hypertension BP normotensive. Asymptomatic. Will check CMP today. Continue current regimen.   Hypothyroidism Repeat TSH.  Hyperlipidemia Repeat fasting lipids and LFT today.  Major depression in remission ([place]) Deterioration in depression and anxiety due to recent significant stressors. Increase Prozac to 40 mg daily. Close follow-up scheduled.   Visit for preventive health examination Depression screen negative. Health Maintenance reviewed. Preventive schedule discussed and handout given in AVS. Will obtain fasting labs today.     Heidi Rio,  PA-C

## 2017-07-29 ENCOUNTER — Encounter: Payer: Self-pay | Admitting: Physician Assistant

## 2017-07-30 NOTE — Assessment & Plan Note (Signed)
Deterioration in depression and anxiety due to recent significant stressors. Increase Prozac to 40 mg daily. Close follow-up scheduled.

## 2017-07-30 NOTE — Assessment & Plan Note (Signed)
Depression screen negative. Health Maintenance reviewed. Preventive schedule discussed and handout given in AVS. Will obtain fasting labs today.  

## 2017-07-30 NOTE — Assessment & Plan Note (Signed)
BP normotensive. Asymptomatic. Will check CMP today. Continue current regimen.

## 2017-07-30 NOTE — Assessment & Plan Note (Signed)
Repeat TSH

## 2017-07-30 NOTE — Assessment & Plan Note (Signed)
Repeat fasting lipids and LFT today.

## 2017-08-09 MED ORDER — ESCITALOPRAM OXALATE 10 MG PO TABS: 10.0000 mg | ORAL_TABLET | Freq: Every day | ORAL | 1 refills | Status: DC

## 2017-08-09 MED FILL — ESCITALOPRAM 10 MG TABLET: 10 | 30 days supply | Qty: 30 | Fill #0

## 2017-08-16 ENCOUNTER — Other Ambulatory Visit: Payer: Self-pay | Admitting: Physician Assistant

## 2017-08-16 MED FILL — SIMVASTATIN 20 MG TABS: 20 | 90 days supply | Qty: 90 | Fill #0

## 2017-08-16 MED FILL — SYNTHROID 88 MCG TABLET: 88 | 90 days supply | Qty: 90 | Fill #0

## 2017-09-11 MED FILL — ESCITALOPRAM 10 MG TABLET: 10 | 30 days supply | Qty: 30 | Fill #1

## 2017-10-05 ENCOUNTER — Encounter: Payer: Self-pay | Admitting: Physician Assistant

## 2017-10-06 ENCOUNTER — Other Ambulatory Visit: Payer: Self-pay | Admitting: Physician Assistant

## 2017-10-06 MED FILL — LISINOPRIL-HCTZ 10-12.5 MG: 10-12.5 | 90 days supply | Qty: 45 | Fill #1

## 2017-10-06 MED FILL — ESCITALOPRAM 10 MG TABLET: 10 | 90 days supply | Qty: 90 | Fill #0

## 2017-11-16 ENCOUNTER — Other Ambulatory Visit: Payer: Self-pay | Admitting: Physician Assistant

## 2017-11-16 ENCOUNTER — Encounter: Payer: Self-pay | Admitting: Emergency Medicine

## 2017-11-16 DIAGNOSIS — E039 Hypothyroidism, unspecified: Secondary | ICD-10-CM

## 2017-11-16 MED FILL — SIMVASTATIN 20 MG TABS: 20 | 90 days supply | Qty: 90 | Fill #0

## 2017-11-16 MED FILL — SYNTHROID 88 MCG TABLET: 88 | 90 days supply | Qty: 90 | Fill #0

## 2017-11-16 NOTE — Telephone Encounter (Signed)
Patient is due for a 3 month repeat TSH check. Future lab ordered. My chart message sent to patient

## 2018-01-08 MED FILL — ESCITALOPRAM 10 MG TABLET: 10 | 90 days supply | Qty: 90 | Fill #1

## 2018-01-09 ENCOUNTER — Other Ambulatory Visit: Payer: Self-pay | Admitting: Physician Assistant

## 2018-01-09 DIAGNOSIS — I10 Essential (primary) hypertension: Secondary | ICD-10-CM

## 2018-01-09 MED FILL — LISINOPRIL-HCTZ 10-12.5 MG: 10-12.5 | 90 days supply | Qty: 45 | Fill #0

## 2018-02-13 ENCOUNTER — Other Ambulatory Visit: Payer: Self-pay | Admitting: Physician Assistant

## 2018-02-13 MED FILL — SYNTHROID 88 MCG TABLET: 88 | 30 days supply | Qty: 30 | Fill #0

## 2018-02-14 ENCOUNTER — Encounter: Payer: Self-pay | Admitting: Physician Assistant

## 2018-02-16 ENCOUNTER — Ambulatory Visit (INDEPENDENT_AMBULATORY_CARE_PROVIDER_SITE_OTHER): Payer: No Typology Code available for payment source | Admitting: Physician Assistant

## 2018-02-16 ENCOUNTER — Encounter: Payer: Self-pay | Admitting: Physician Assistant

## 2018-02-16 ENCOUNTER — Other Ambulatory Visit: Payer: Self-pay

## 2018-02-16 VITALS — BP 118/70 | HR 74 | Temp 98.3°F | Resp 14 | Ht 63.0 in | Wt 194.0 lb

## 2018-02-16 DIAGNOSIS — Z1231 Encounter for screening mammogram for malignant neoplasm of breast: Secondary | ICD-10-CM | POA: Diagnosis not present

## 2018-02-16 DIAGNOSIS — L821 Other seborrheic keratosis: Secondary | ICD-10-CM

## 2018-02-16 DIAGNOSIS — Z1239 Encounter for other screening for malignant neoplasm of breast: Secondary | ICD-10-CM

## 2018-02-16 NOTE — Progress Notes (Signed)
Patient presents to clinic today c/o bump noted on her R breast over the past couple of days while performing a breast exam at home. Was concerned and wanted checked this checked out. Denies other changes. IS due for her screening mammogram.    Past Medical History:  Diagnosis Date  . Arthritis   . Asthma   . Cancer ([place])    Glioblastoma at 62 years old  . Thyroid disease    hypothyroidism    Current Outpatient Medications on File Prior to Visit  Medication Sig Dispense Refill  . acetaminophen (TYLENOL) 500 MG tablet Take 500 mg by mouth every 6 (six) hours as needed.    Marland Kitchen albuterol (PROVENTIL HFA;VENTOLIN HFA) 108 (90 Base) MCG/ACT inhaler Inhale 2 puffs into the lungs every 4 (four) hours as needed for wheezing (cough, shortness of breath or wheezing.). 1 Inhaler 2  . b complex vitamins tablet Take 1 tablet by mouth daily.    Marland Kitchen escitalopram (LEXAPRO) 10 MG tablet TAKE 1 TABLET (10 MG TOTAL) BY MOUTH DAILY. 90 tablet 1  . lisinopril-hydrochlorothiazide (PRINZIDE,ZESTORETIC) 10-12.5 MG tablet TAKE 1/2 TABLET BY MOUTH DAILY. 45 tablet 1  . Omega-3 Fatty Acids (FISH OIL) 1000 MG CAPS Take 1 capsule (1,000 mg total) by mouth daily. 30 capsule 0  . simvastatin (ZOCOR) 20 MG tablet TAKE 1 TABLET (20 MG TOTAL) BY MOUTH AT BEDTIME. 90 tablet 1  . SYNTHROID 88 MCG tablet TAKE 1 TABLET BY MOUTH DAILY BEFORE BREAKFAST. 30 tablet 0   No current facility-administered medications on file prior to visit.     No Known Allergies  Family History  Problem Relation Age of Onset  . COPD Mother   . Hypertension Father   . Dementia Father   . COPD Brother     Social History   Socioeconomic History  . Marital status: Married    Spouse name: Not on file  . Number of children: 3  . Years of education: Not on file  . Highest education level: Not on file  Occupational History  . Occupation: front desk    Comment: [employer]  Social Needs  . Financial resource  strain: Not on file  . Food insecurity:    Worry: Not on file    Inability: Not on file  . Transportation needs:    Medical: Not on file    Non-medical: Not on file  Tobacco Use  . Smoking status: Former Smoker    Packs/day: 1.00    Years: 10.00    Pack years: 10.00    Types: Cigarettes    Last attempt to quit: 11/22/1981    Years since quitting: 36.2  . Smokeless tobacco: Never Used  Substance and Sexual Activity  . Alcohol use: No  . Drug use: No  . Sexual activity: Yes  Lifestyle  . Physical activity:    Days per week: Not on file    Minutes per session: Not on file  . Stress: Not on file  Relationships  . Social connections:    Talks on phone: Not on file    Gets together: Not on file    Attends religious service: Not on file    Active member of club or organization: Not on file    Attends meetings of clubs or organizations: Not on file    Relationship status: Not on file  Other Topics Concern  . Not on file  Social History Narrative  . Not on file   Review  of Systems - See HPI.  All other ROS are negative.  BP 118/70   Pulse 74   Temp 98.3 F (36.8 C) (Oral)   Resp 14   Ht 5\' 3"  (1.6 m)   Wt 194 lb (88 kg)   SpO2 98%   BMI 34.37 kg/m   Physical Exam  Constitutional: She is oriented to person, place, and time. She appears well-developed and well-nourished.  HENT:  Head: Normocephalic and atraumatic.  Cardiovascular: Normal rate, regular rhythm, normal heart sounds and intact distal pulses.  Pulmonary/Chest: Effort normal.  Chaperone present for entirety of breast examination.  Other than skin lesion noted, exam unremarkable.       Neurological: She is alert and oriented to person, place, and time.  Vitals reviewed.    Assessment/Plan: 1. Breast cancer screening Due for routine screen. Breast exam unremarkable for concerning findings or mass. Order for screening mammogram placed.  - MM DIGITAL SCREENING BILATERAL; Future  2. Seborrheic  keratosis Reassurance given. Discussed this can be removed if inflamed or for cosmetic purposes. Declines removal today. Will monitor.    Leeanne Rio, PA-C

## 2018-02-16 NOTE — Patient Instructions (Signed)
The skin lesion is consistent with a developing seborrheic keratosis. This is a benign skin lesion. Not associated with your breast tissue. Your breast examination today was unremarkable. We will set you up for a routine screening mammogram.    Seborrheic Keratosis Seborrheic keratosis is a common, noncancerous (benign) skin growth. This condition causes waxy, rough, tan, brown, or black spots to appear on the skin. These skin growths can be flat or raised. What are the causes? The cause of this condition is not known. What increases the risk? This condition is more likely to develop in:  People who have a family history of seborrheic keratosis.  People who are 60 or older.  People who are pregnant.  People who have had estrogen replacement therapy.  What are the signs or symptoms? This condition often occurs on the face, chest, shoulders, back, or other areas. These growths:  Are usually painless, but may become irritated and itchy.  Can be yellow, brown, black, or other colors.  Are slightly raised or have a flat surface.  Are sometimes rough or wart-like in texture.  Are often waxy on the surface.  Are round or oval-shaped.  Sometimes look like they are "stuck on."  Often occur in groups, but may occur as a single growth.  How is this diagnosed? This condition is diagnosed with a medical history and physical exam. A sample of the growth may be tested (skin biopsy). You may need to see a skin specialist (dermatologist). How is this treated? Treatment is not usually needed for this condition, unless the growths are irritated or are often bleeding. You may also choose to have the growths removed if you do not like their appearance. Most commonly, these growths are treated with a procedure in which liquid nitrogen is applied to "freeze" off the growth (cryosurgery). They may also be burned off with electricity or cut off. Follow these instructions at home:  Watch your growth  for any changes.  Keep all follow-up visits as told by your health care provider. This is important.  Do not scratch or pick at the growth or growths. This can cause them to become irritated or infected. Contact a health care provider if:  You suddenly have many new growths.  Your growth bleeds, itches, or hurts.  Your growth suddenly becomes larger or changes color. This information is not intended to replace advice given to you by your health care provider. Make sure you discuss any questions you have with your health care provider. Document Released: 07/10/2010 Document Revised: 11/13/2015 Document Reviewed: 10/23/2014 Elsevier Interactive Patient Education  2018 [employer].

## 2018-02-17 MED FILL — AMOXICILLIN 875 MG TABS: 875 | 5 days supply | Qty: 10 | Fill #0

## 2018-02-17 MED FILL — HYDROCODON-APAP 7.5-325: 7.5-325 | 2 days supply | Qty: 8 | Fill #0

## 2018-02-27 MED FILL — SIMVASTATIN 20 MG TABLET: 20 | 90 days supply | Qty: 90 | Fill #1

## 2018-03-14 ENCOUNTER — Other Ambulatory Visit: Payer: Self-pay | Admitting: Physician Assistant

## 2018-03-14 MED FILL — SYNTHROID 88 MCG TABLET: 88 | 30 days supply | Qty: 30 | Fill #0

## 2018-04-10 ENCOUNTER — Other Ambulatory Visit: Payer: Self-pay | Admitting: Physician Assistant

## 2018-04-12 MED FILL — ESCITALOPRAM 10 MG TABLET: 10 | 90 days supply | Qty: 90 | Fill #0

## 2018-04-18 ENCOUNTER — Other Ambulatory Visit: Payer: Self-pay | Admitting: Physician Assistant

## 2018-04-18 ENCOUNTER — Other Ambulatory Visit: Payer: Self-pay | Admitting: Emergency Medicine

## 2018-04-18 MED FILL — LISINOPRIL-HCTZ 10-12.5 TAB: 10-12.5 | 90 days supply | Qty: 45 | Fill #1

## 2018-04-18 MED FILL — SYNTHROID 88 MCG TABLET: 88 | 15 days supply | Qty: 15 | Fill #0

## 2018-04-27 ENCOUNTER — Other Ambulatory Visit (INDEPENDENT_AMBULATORY_CARE_PROVIDER_SITE_OTHER): Payer: No Typology Code available for payment source

## 2018-04-27 ENCOUNTER — Ambulatory Visit: Payer: No Typology Code available for payment source | Admitting: Physician Assistant

## 2018-04-27 ENCOUNTER — Other Ambulatory Visit: Payer: Self-pay | Admitting: Physician Assistant

## 2018-04-27 DIAGNOSIS — E039 Hypothyroidism, unspecified: Secondary | ICD-10-CM | POA: Diagnosis not present

## 2018-04-27 LAB — TSH: TSH: 12.19 u[IU]/mL — AB (ref 0.35–4.50)

## 2018-04-27 NOTE — Progress Notes (Signed)
tsh

## 2018-04-28 ENCOUNTER — Other Ambulatory Visit: Payer: Self-pay | Admitting: Physician Assistant

## 2018-04-28 ENCOUNTER — Other Ambulatory Visit: Payer: Self-pay | Admitting: Emergency Medicine

## 2018-04-28 DIAGNOSIS — E039 Hypothyroidism, unspecified: Secondary | ICD-10-CM

## 2018-04-28 MED ORDER — SYNTHROID 100 MCG PO TABS: 100.0000 ug | ORAL_TABLET | Freq: Every day | ORAL | 1 refills | Status: DC

## 2018-04-28 MED FILL — SYNTHROID 100 MCG TABLET: 100 | 30 days supply | Qty: 30 | Fill #0

## 2018-05-22 ENCOUNTER — Other Ambulatory Visit: Payer: Self-pay | Admitting: Physician Assistant

## 2018-05-22 MED FILL — SYNTHROID 100 MCG TABLET: 100 | 30 days supply | Qty: 30 | Fill #1

## 2018-05-22 MED FILL — SIMVASTATIN 20 MG TABLET: 20 | 90 days supply | Qty: 90 | Fill #0

## 2018-06-19 ENCOUNTER — Other Ambulatory Visit: Payer: Self-pay | Admitting: Physician Assistant

## 2018-06-19 DIAGNOSIS — E039 Hypothyroidism, unspecified: Secondary | ICD-10-CM

## 2018-06-19 MED FILL — SYNTHROID 100 MCG TABLET: 100 | 30 days supply | Qty: 30 | Fill #0

## 2018-06-22 ENCOUNTER — Other Ambulatory Visit: Payer: Self-pay | Admitting: Physician Assistant

## 2018-06-22 DIAGNOSIS — Z1231 Encounter for screening mammogram for malignant neoplasm of breast: Secondary | ICD-10-CM

## 2018-07-17 ENCOUNTER — Other Ambulatory Visit: Payer: Self-pay | Admitting: Physician Assistant

## 2018-07-17 DIAGNOSIS — E039 Hypothyroidism, unspecified: Secondary | ICD-10-CM

## 2018-07-17 DIAGNOSIS — I10 Essential (primary) hypertension: Secondary | ICD-10-CM

## 2018-07-17 MED FILL — ESCITALOPRAM 10 MG TABLET: 10 | 90 days supply | Qty: 90 | Fill #1

## 2018-07-19 ENCOUNTER — Other Ambulatory Visit: Payer: Self-pay

## 2018-07-19 ENCOUNTER — Encounter: Payer: Self-pay | Admitting: Physician Assistant

## 2018-07-19 ENCOUNTER — Ambulatory Visit (INDEPENDENT_AMBULATORY_CARE_PROVIDER_SITE_OTHER): Payer: No Typology Code available for payment source | Admitting: Physician Assistant

## 2018-07-19 VITALS — BP 112/80 | HR 85 | Temp 98.6°F | Resp 16 | Ht 63.0 in | Wt 197.0 lb

## 2018-07-19 DIAGNOSIS — J069 Acute upper respiratory infection, unspecified: Secondary | ICD-10-CM

## 2018-07-19 DIAGNOSIS — B9689 Other specified bacterial agents as the cause of diseases classified elsewhere: Secondary | ICD-10-CM

## 2018-07-19 LAB — TSH: TSH: 3.74 u[IU]/mL (ref 0.35–4.50)

## 2018-07-19 MED ORDER — AMOXICILLIN-POT CLAVULANATE 875-125 MG PO TABS
1.0000 | ORAL_TABLET | Freq: Two times a day (BID) | ORAL | 0 refills | Status: DC
Start: 2018-07-19 — End: 2018-08-21

## 2018-07-19 MED ORDER — HYDROCOD POLST-CPM POLST ER 10-8 MG/5ML PO SUER: 5.0000 mL | Freq: Two times a day (BID) | ORAL | 0 refills | Status: DC | PRN

## 2018-07-19 MED FILL — LISINOPRIL-HCTZ 10-12.5 MG: 10-12.5 | 90 days supply | Qty: 45 | Fill #0

## 2018-07-19 MED FILL — AMOX-CLAV 875-125 MG TABLET: 875-125 | 7 days supply | Qty: 14 | Fill #0

## 2018-07-19 MED FILL — SYNTHROID 100 MCG TABLET: 100 | 90 days supply | Qty: 90 | Fill #0

## 2018-07-19 MED FILL — HYDROCODONE-CHLORPHEN ER SU: 10-8 | 14 days supply | Qty: 140 | Fill #0

## 2018-07-19 NOTE — Patient Instructions (Signed)
Please go to the lab today for blood work.  I will call you with your results. We will alter treatment regimen(s) if indicated by your results.    Please take antibiotic as directed.  Increase fluid intake.  Use Saline nasal spray.  Take a daily multivitamin. Use the cough medication as directed. Rest your voice!  Place a humidifier in the bedroom.  Please call or return clinic if symptoms are not improving.  Sinusitis/Bronchitis Sinusitis is redness, soreness, and swelling (inflammation) of the paranasal sinuses. Paranasal sinuses are air pockets within the bones of your face (beneath the eyes, the middle of the forehead, or above the eyes). In healthy paranasal sinuses, mucus is able to drain out, and air is able to circulate through them by way of your nose. However, when your paranasal sinuses are inflamed, mucus and air can become trapped. This can allow bacteria and other germs to grow and cause infection. Sinusitis can develop quickly and last only a short time (acute) or continue over a long period (chronic). Sinusitis that lasts for more than 12 weeks is considered chronic.  CAUSES  Causes of sinusitis include:  Allergies.  Structural abnormalities, such as displacement of the cartilage that separates your nostrils (deviated septum), which can decrease the air flow through your nose and sinuses and affect sinus drainage.  Functional abnormalities, such as when the small hairs (cilia) that line your sinuses and help remove mucus do not work properly or are not present. SYMPTOMS  Symptoms of acute and chronic sinusitis are the same. The primary symptoms are pain and pressure around the affected sinuses. Other symptoms include:  Upper toothache.  Earache.  Headache.  Bad breath.  Decreased sense of smell and taste.  A cough, which worsens when you are lying flat.  Fatigue.  Fever.  Thick drainage from your nose, which often is green and may contain pus  (purulent).  Swelling and warmth over the affected sinuses. DIAGNOSIS  Your caregiver will perform a physical exam. During the exam, your caregiver may:  Look in your nose for signs of abnormal growths in your nostrils (nasal polyps).  Tap over the affected sinus to check for signs of infection.  View the inside of your sinuses (endoscopy) with a special imaging device with a light attached (endoscope), which is inserted into your sinuses. If your caregiver suspects that you have chronic sinusitis, one or more of the following tests may be recommended:  Allergy tests.  Nasal culture A sample of mucus is taken from your nose and sent to a lab and screened for bacteria.  Nasal cytology A sample of mucus is taken from your nose and examined by your caregiver to determine if your sinusitis is related to an allergy. TREATMENT  Most cases of acute sinusitis are related to a viral infection and will resolve on their own within 10 days. Sometimes medicines are prescribed to help relieve symptoms (pain medicine, decongestants, nasal steroid sprays, or saline sprays).  However, for sinusitis related to a bacterial infection, your caregiver will prescribe antibiotic medicines. These are medicines that will help kill the bacteria causing the infection.  Rarely, sinusitis is caused by a fungal infection. In theses cases, your caregiver will prescribe antifungal medicine. For some cases of chronic sinusitis, surgery is needed. Generally, these are cases in which sinusitis recurs more than 3 times per year, despite other treatments. HOME CARE INSTRUCTIONS   Drink plenty of water. Water helps thin the mucus so your sinuses can drain more  easily.  Use a humidifier.  Inhale steam 3 to 4 times a day (for example, sit in the bathroom with the shower running).  Apply a warm, moist washcloth to your face 3 to 4 times a day, or as directed by your caregiver.  Use saline nasal sprays to help moisten and  clean your sinuses.  Take over-the-counter or prescription medicines for pain, discomfort, or fever only as directed by your caregiver. SEEK IMMEDIATE MEDICAL CARE IF:  You have increasing pain or severe headaches.  You have nausea, vomiting, or drowsiness.  You have swelling around your face.  You have vision problems.  You have a stiff neck.  You have difficulty breathing. MAKE SURE YOU:   Understand these instructions.  Will watch your condition.  Will get help right away if you are not doing well or get worse. Document Released: 06/07/2005 Document Revised: 08/30/2011 Document Reviewed: 06/22/2011 [facility] Patient Information 2014 [place], [place].

## 2018-07-19 NOTE — Progress Notes (Signed)
Acute Office Visit  Subjective:    Patient ID: TASIA LIZ, female    DOB: 1956-04-04, 63 y.o.   MRN: [id number]  Chief Complaint  Patient presents with  . Sore Throat    HPI Patient is in today for 1 week ago with congestion, runny nose, some ear fullness and pressure. Noted acute worsening of symptoms 2 days ago worsened with productive cough with  white thick sputum, sore throat, hoarseness, body aches, painful swallowing, headaches at frontal sinus.  Pt is trying to stay hydrated, warm liquids are soothing to throat. Pt denies fever, chills, nausea, vomiting, diarrhea Tylenol and dayquil/nyquil are not providing much relief  Past Medical History:  Diagnosis Date  . Arthritis   . Asthma   . Cancer ([place])    Glioblastoma at 63 years old  . Thyroid disease    hypothyroidism    Past Surgical History:  Procedure Laterality Date  . BRAIN SURGERY  63 yo  . CHOLECYSTECTOMY  1998  . EXPLORATORY LAPAROTOMY  1983    Family History  Problem Relation Age of Onset  . COPD Mother   . Hypertension Father   . Dementia Father   . COPD Brother     Social History   Socioeconomic History  . Marital status: Married    Spouse name: Not on file  . Number of children: 3  . Years of education: Not on file  . Highest education level: Not on file  Occupational History  . Occupation: front desk    Comment: [employer]  Social Needs  . Financial resource strain: Not on file  . Food insecurity:    Worry: Not on file    Inability: Not on file  . Transportation needs:    Medical: Not on file    Non-medical: Not on file  Tobacco Use  . Smoking status: Former Smoker    Packs/day: 1.00    Years: 10.00    Pack years: 10.00    Types: Cigarettes    Last attempt to quit: 11/22/1981    Years since quitting: 36.6  . Smokeless tobacco: Never Used  Substance and Sexual Activity  . Alcohol use: No  . Drug use: No  . Sexual activity: Yes  Lifestyle  .  Physical activity:    Days per week: Not on file    Minutes per session: Not on file  . Stress: Not on file  Relationships  . Social connections:    Talks on phone: Not on file    Gets together: Not on file    Attends religious service: Not on file    Active member of club or organization: Not on file    Attends meetings of clubs or organizations: Not on file    Relationship status: Not on file  . Intimate partner violence:    Fear of current or ex partner: Not on file    Emotionally abused: Not on file    Physically abused: Not on file    Forced sexual activity: Not on file  Other Topics Concern  . Not on file  Social History Narrative  . Not on file    Outpatient Medications Prior to Visit  Medication Sig Dispense Refill  . acetaminophen (TYLENOL) 500 MG tablet Take 500 mg by mouth every 6 (six) hours as needed.    Marland Kitchen albuterol (PROVENTIL HFA;VENTOLIN HFA) 108 (90 Base) MCG/ACT inhaler Inhale 2 puffs into the lungs every 4 (four) hours as needed for wheezing (  cough, shortness of breath or wheezing.). 1 Inhaler 2  . b complex vitamins tablet Take 1 tablet by mouth daily.    Marland Kitchen escitalopram (LEXAPRO) 10 MG tablet TAKE 1 TABLET (10 MG TOTAL) BY MOUTH DAILY. 90 tablet 1  . lisinopril-hydrochlorothiazide (PRINZIDE,ZESTORETIC) 10-12.5 MG tablet TAKE 1/2 TABLET BY MOUTH DAILY. 45 tablet 1  . Omega-3 Fatty Acids (FISH OIL) 1000 MG CAPS Take 1 capsule (1,000 mg total) by mouth daily. 30 capsule 0  . simvastatin (ZOCOR) 20 MG tablet TAKE 1 TABLET BY MOUTH AT BEDTIME. 90 tablet 1  . SYNTHROID 100 MCG tablet TAKE 1 TABLET BY MOUTH DAILY BEFORE BREAKFAST. 90 tablet 1   No facility-administered medications prior to visit.     No Known Allergies  ROS Pertinent ROS are listed in the HPI.    Objective:    Physical Exam  Constitutional: She appears well-developed and well-nourished. She has a sickly appearance.  HENT:  Head: Normocephalic and atraumatic.  Mouth/Throat: Uvula is midline  and oropharynx is clear and moist.  Eyes: Conjunctivae are normal.  Neck: Neck supple.  Cardiovascular: Normal rate, regular rhythm and normal heart sounds.  Pulmonary/Chest: Effort normal and breath sounds normal.  Lymphadenopathy:    She has no cervical adenopathy.  Neurological: She is alert.    BP 112/80   Pulse 85   Temp 98.6 F (37 C) (Oral)   Resp 16   Ht 5\' 3"  (1.6 m)   Wt 89.4 kg   SpO2 97%   BMI 34.90 kg/m  Wt Readings from Last 3 Encounters:  07/19/18 89.4 kg  02/16/18 88 kg  07/28/17 84.8 kg    Health Maintenance Due  Topic Date Due  . INFLUENZA VACCINE  01/19/2018    There are no preventive care reminders to display for this patient.   Lab Results  Component Value Date   TSH 12.19 (H) 04/27/2018   Lab Results  Component Value Date   WBC 7.5 07/28/2017   HGB 13.4 07/28/2017   HCT 40.9 07/28/2017   MCV 82.1 07/28/2017   PLT 338.0 07/28/2017   Lab Results  Component Value Date   NA 139 07/28/2017   K 4.7 07/28/2017   CO2 28 07/28/2017   GLUCOSE 96 07/28/2017   BUN 20 07/28/2017   CREATININE 0.79 07/28/2017   BILITOT 0.4 07/28/2017   ALKPHOS 73 07/28/2017   AST 12 07/28/2017   ALT 16 07/28/2017   PROT 7.1 07/28/2017   ALBUMIN 4.6 07/28/2017   CALCIUM 9.5 07/28/2017   ANIONGAP 11 04/10/2017   GFR 78.24 07/28/2017   Lab Results  Component Value Date   CHOL 152 07/28/2017   Lab Results  Component Value Date   HDL 46.50 07/28/2017   Lab Results  Component Value Date   LDLCALC 66 07/28/2017   Lab Results  Component Value Date   TRIG 199.0 (H) 07/28/2017   Lab Results  Component Value Date   CHOLHDL 3 07/28/2017   Lab Results  Component Value Date   HGBA1C 6.1 07/28/2017       Assessment & Plan:   .1. Bacterial URI Elements of sinusitis and upper bronchial inflammation. With double worsening, will start antibiotics. Rx Augmentin. Supportive measures and OTC medications reviewed. Rx Tussionex.   - amoxicillin-clavulanate  (AUGMENTIN) 875-125 MG tablet; Take 1 tablet by mouth 2 (two) times daily.  Dispense: 14 tablet; Refill: 0 - chlorpheniramine-HYDROcodone (TUSSIONEX PENNKINETIC ER) 10-8 MG/5ML SUER; Take 5 mLs by mouth every 12 (twelve) hours as needed.  Dispense: 140 mL; Refill: 0 - TSH   No orders of the defined types were placed in this encounter.    Erin E Mecum, Student-PA

## 2018-07-21 ENCOUNTER — Ambulatory Visit
Admission: RE | Admit: 2018-07-21 | Discharge: 2018-07-21 | Disposition: A | Discharge status: Home / Self Care | Payer: No Typology Code available for payment source | Source: Ambulatory Visit | Attending: Physician Assistant | Admitting: Physician Assistant

## 2018-07-21 DIAGNOSIS — Z1231 Encounter for screening mammogram for malignant neoplasm of breast: Secondary | ICD-10-CM

## 2018-07-24 ENCOUNTER — Encounter: Payer: Self-pay | Admitting: Emergency Medicine

## 2018-08-21 ENCOUNTER — Ambulatory Visit (INDEPENDENT_AMBULATORY_CARE_PROVIDER_SITE_OTHER): Payer: No Typology Code available for payment source | Admitting: Physician Assistant

## 2018-08-21 ENCOUNTER — Other Ambulatory Visit: Payer: Self-pay

## 2018-08-21 ENCOUNTER — Encounter: Payer: Self-pay | Admitting: Physician Assistant

## 2018-08-21 VITALS — BP 102/80 | HR 108 | Temp 98.9°F | Resp 16 | Ht 63.0 in | Wt 198.0 lb

## 2018-08-21 DIAGNOSIS — J069 Acute upper respiratory infection, unspecified: Secondary | ICD-10-CM | POA: Diagnosis not present

## 2018-08-21 LAB — POC INFLUENZA A&B (BINAX/QUICKVUE)
Influenza A, POC: NEGATIVE
Influenza B, POC: NEGATIVE

## 2018-08-21 MED ORDER — OSELTAMIVIR PHOSPHATE 75 MG PO CAPS: 75.0000 mg | ORAL_CAPSULE | Freq: Two times a day (BID) | ORAL | 0 refills | Status: DC

## 2018-08-21 MED ORDER — HYDROCOD POLST-CPM POLST ER 10-8 MG/5ML PO SUER: 5.0000 mL | Freq: Two times a day (BID) | ORAL | 0 refills | Status: DC | PRN

## 2018-08-21 MED FILL — OSELTAMIVIR PHOSPHATE 75 MG: 75 | 5 days supply | Qty: 10 | Fill #0

## 2018-08-21 MED FILL — HYDROCODONE-CHLORPHEN ER SU: 10-8 | 7 days supply | Qty: 70 | Fill #0

## 2018-08-21 NOTE — Patient Instructions (Signed)
Please keep hydrated and get plenty of rest. Start plain Mucinex to thin any congestion. Alternate tylenol and ibuprofen for fever or aches. Take the Tamiflu as directed and Tussionex as directed if needed for cough.  Let me know if things are easing up.

## 2018-08-21 NOTE — Progress Notes (Signed)
Patient presents to clinic today c/o 1 day of nasal congestion, chills, aches and fatigue. Is unsure of fever but has felt warm. Husband has been sick with similar symptoms but these have lasted a week or son. Denies chest pain or SOB. Notes cough that is dry but persistent. Has history of asthma. Denies wheezing. Has been hydrating. Has taken Tylenol this morning. Nyquil last night. No recent travel.   Past Medical History:  Diagnosis Date  . Arthritis   . Asthma   . Cancer ([place])    Glioblastoma at 63 years old  . Thyroid disease    hypothyroidism    Current Outpatient Medications on File Prior to Visit  Medication Sig Dispense Refill  . acetaminophen (TYLENOL) 500 MG tablet Take 500 mg by mouth every 6 (six) hours as needed.    Marland Kitchen albuterol (PROVENTIL HFA;VENTOLIN HFA) 108 (90 Base) MCG/ACT inhaler Inhale 2 puffs into the lungs every 4 (four) hours as needed for wheezing (cough, shortness of breath or wheezing.). 1 Inhaler 2  . b complex vitamins tablet Take 1 tablet by mouth daily.    Marland Kitchen escitalopram (LEXAPRO) 10 MG tablet TAKE 1 TABLET (10 MG TOTAL) BY MOUTH DAILY. 90 tablet 1  . lisinopril-hydrochlorothiazide (PRINZIDE,ZESTORETIC) 10-12.5 MG tablet TAKE 1/2 TABLET BY MOUTH DAILY. 45 tablet 1  . Omega-3 Fatty Acids (FISH OIL) 1000 MG CAPS Take 1 capsule (1,000 mg total) by mouth daily. 30 capsule 0  . simvastatin (ZOCOR) 20 MG tablet TAKE 1 TABLET BY MOUTH AT BEDTIME. 90 tablet 1  . SYNTHROID 100 MCG tablet TAKE 1 TABLET BY MOUTH DAILY BEFORE BREAKFAST. 90 tablet 1   No current facility-administered medications on file prior to visit.     No Known Allergies  Family History  Problem Relation Age of Onset  . COPD Mother   . Hypertension Father   . Dementia Father   . COPD Brother     Social History   Socioeconomic History  . Marital status: Married    Spouse name: Not on file  . Number of children: 3  . Years of education: Not on file  . Highest education level: Not on  file  Occupational History  . Occupation: front desk    Comment: [employer]  Social Needs  . Financial resource strain: Not on file  . Food insecurity:    Worry: Not on file    Inability: Not on file  . Transportation needs:    Medical: Not on file    Non-medical: Not on file  Tobacco Use  . Smoking status: Former Smoker    Packs/day: 1.00    Years: 10.00    Pack years: 10.00    Types: Cigarettes    Last attempt to quit: 11/22/1981    Years since quitting: 36.7  . Smokeless tobacco: Never Used  Substance and Sexual Activity  . Alcohol use: No  . Drug use: No  . Sexual activity: Yes  Lifestyle  . Physical activity:    Days per week: Not on file    Minutes per session: Not on file  . Stress: Not on file  Relationships  . Social connections:    Talks on phone: Not on file    Gets together: Not on file    Attends religious service: Not on file    Active member of club or organization: Not on file    Attends meetings of clubs or organizations: Not on file    Relationship  status: Not on file  Other Topics Concern  . Not on file  Social History Narrative  . Not on file   Review of Systems - See HPI.  All other ROS are negative.  BP 102/80   Pulse (!) 108   Temp 98.9 F (37.2 C) (Oral)   Resp 16   Ht 5\' 3"  (1.6 m)   Wt 198 lb (89.8 kg)   SpO2 96%   BMI 35.07 kg/m   Physical Exam Vitals signs reviewed.  Constitutional:      Appearance: Normal appearance.  HENT:     Head: Normocephalic and atraumatic.     Right Ear: Tympanic membrane, ear canal and external ear normal.     Left Ear: Tympanic membrane, ear canal and external ear normal.     Nose: Congestion and rhinorrhea present.     Mouth/Throat:     Mouth: Mucous membranes are moist.  Eyes:     Conjunctiva/sclera: Conjunctivae normal.     Pupils: Pupils are equal, round, and reactive to light.  Neck:     Musculoskeletal: Neck supple.  Cardiovascular:     Rate and Rhythm:  Regular rhythm. Tachycardia present.     Pulses: Normal pulses.     Heart sounds: Normal heart sounds.  Pulmonary:     Effort: Pulmonary effort is normal.     Breath sounds: Normal breath sounds.  Lymphadenopathy:     Cervical: No cervical adenopathy.  Neurological:     General: No focal deficit present.     Mental Status: She is alert and oriented to person, place, and time.  Psychiatric:        Mood and Affect: Mood normal.     Recent Results (from the past 2160 hour(s))  TSH     Status: None   Collection Time: 07/19/18  9:52 AM  Result Value Ref Range   TSH 3.74 0.35 - 4.50 uIU/mL  POC Influenza A&B(BINAX/QUICKVUE)     Status: Normal   Collection Time: 08/21/18 10:55 AM  Result Value Ref Range   Influenza A, POC Negative Negative   Influenza B, POC Negative Negative    Assessment/Plan: 1. Viral URI < 24 hours of symptoms. Classic flu-like symptoms. Tylenol before appointment. Flu swab negative but giving classic symptoms will start Tamiflu. Rx Tussionex. Supportive measures and OTC medications discussed.  - POC Influenza A&B(BINAX/QUICKVUE) - chlorpheniramine-HYDROcodone (TUSSIONEX PENNKINETIC ER) 10-8 MG/5ML SUER; Take 5 mLs by mouth every 12 (twelve) hours as needed for cough.  Dispense: 70 mL; Refill: 0 - oseltamivir (TAMIFLU) 75 MG capsule; Take 1 capsule (75 mg total) by mouth 2 (two) times daily.  Dispense: 10 capsule; Refill: 0   Leeanne Rio, PA-C

## 2018-08-30 MED FILL — SIMVASTATIN 20 MG TABLET: 20 | 90 days supply | Qty: 90 | Fill #1

## 2018-09-28 ENCOUNTER — Other Ambulatory Visit: Payer: Self-pay | Admitting: Physician Assistant

## 2018-09-28 MED FILL — LISINOPRIL-HCTZ 10-12.5 MG: 10-12.5 | 90 days supply | Qty: 45 | Fill #1

## 2018-09-28 MED FILL — ESCITALOPRAM 10 MG TABLET: 10 | 90 days supply | Qty: 90 | Fill #0

## 2018-09-28 MED FILL — SYNTHROID 100 MCG TABLET: 100 | 90 days supply | Qty: 90 | Fill #1

## 2018-11-14 ENCOUNTER — Encounter: Payer: Self-pay | Admitting: Gastroenterology

## 2018-11-28 ENCOUNTER — Encounter: Payer: Self-pay | Admitting: Emergency Medicine

## 2018-11-28 ENCOUNTER — Other Ambulatory Visit: Payer: Self-pay | Admitting: Physician Assistant

## 2018-11-28 MED FILL — SIMVASTATIN 20 MG TABLET: 20 | 90 days supply | Qty: 90 | Fill #0

## 2018-12-19 ENCOUNTER — Encounter: Payer: Self-pay | Admitting: Gastroenterology

## 2018-12-25 ENCOUNTER — Other Ambulatory Visit: Payer: Self-pay

## 2018-12-25 ENCOUNTER — Ambulatory Visit (AMBULATORY_SURGERY_CENTER): Payer: Self-pay

## 2018-12-25 VITALS — Ht 63.0 in | Wt 205.0 lb

## 2018-12-25 DIAGNOSIS — Z1211 Encounter for screening for malignant neoplasm of colon: Secondary | ICD-10-CM

## 2018-12-25 MED ORDER — NA SULFATE-K SULFATE-MG SULF 17.5-3.13-1.6 GM/177ML PO SOLN: 1.0000 | Freq: Once | ORAL | 0 refills | Status: AC

## 2018-12-25 MED FILL — SUPREP BOWEL PREP KIT: 17.5-3.13-1 | 2 days supply | Qty: 354 | Fill #0

## 2018-12-25 NOTE — Progress Notes (Signed)
Denies allergies to eggs or soy products. Denies complication of anesthesia or sedation. Denies use of weight loss medication. Denies use of O2.   Emmi instructions given for colonoscopy.  Pre-Visit was conducted by phone due to Covid 19. Instructions were reviewed and mailed to patients confirmed home address. A 15.00 coupon for Suprep was given to the patient. Patient was encouraged to call if she had any questions regarding instructions.

## 2018-12-26 ENCOUNTER — Other Ambulatory Visit: Payer: Self-pay | Admitting: *Deleted

## 2018-12-26 DIAGNOSIS — Z129 Encounter for screening for malignant neoplasm, site unspecified: Secondary | ICD-10-CM

## 2018-12-27 ENCOUNTER — Other Ambulatory Visit: Payer: Self-pay

## 2018-12-27 ENCOUNTER — Ambulatory Visit (INDEPENDENT_AMBULATORY_CARE_PROVIDER_SITE_OTHER): Payer: No Typology Code available for payment source | Admitting: Physician Assistant

## 2018-12-27 ENCOUNTER — Encounter: Payer: Self-pay | Admitting: Physician Assistant

## 2018-12-27 VITALS — BP 110/80 | HR 81 | Temp 98.0°F | Resp 16 | Ht 62.5 in | Wt 204.0 lb

## 2018-12-27 DIAGNOSIS — Z0001 Encounter for general adult medical examination with abnormal findings: Secondary | ICD-10-CM | POA: Diagnosis not present

## 2018-12-27 DIAGNOSIS — F331 Major depressive disorder, recurrent, moderate: Secondary | ICD-10-CM

## 2018-12-27 DIAGNOSIS — N3946 Mixed incontinence: Secondary | ICD-10-CM | POA: Diagnosis not present

## 2018-12-27 DIAGNOSIS — I10 Essential (primary) hypertension: Secondary | ICD-10-CM

## 2018-12-27 DIAGNOSIS — E785 Hyperlipidemia, unspecified: Secondary | ICD-10-CM | POA: Diagnosis not present

## 2018-12-27 DIAGNOSIS — E039 Hypothyroidism, unspecified: Secondary | ICD-10-CM

## 2018-12-27 DIAGNOSIS — M542 Cervicalgia: Secondary | ICD-10-CM

## 2018-12-27 DIAGNOSIS — M2560 Stiffness of unspecified joint, not elsewhere classified: Secondary | ICD-10-CM

## 2018-12-27 DIAGNOSIS — Z0000 Encounter for general adult medical examination without abnormal findings: Secondary | ICD-10-CM

## 2018-12-27 LAB — COMPREHENSIVE METABOLIC PANEL
ALT: 18 U/L (ref 0–35)
AST: 13 U/L (ref 0–37)
Albumin: 4.7 g/dL (ref 3.5–5.2)
Alkaline Phosphatase: 84 U/L (ref 39–117)
BUN: 18 mg/dL (ref 6–23)
CO2: 26 mEq/L (ref 19–32)
Calcium: 9.4 mg/dL (ref 8.4–10.5)
Chloride: 106 mEq/L (ref 96–112)
Creatinine, Ser: 0.79 mg/dL (ref 0.40–1.20)
GFR: 73.28 mL/min (ref >60.00)
Glucose, Bld: 94 mg/dL (ref 70–99)
Potassium: 4.4 mEq/L (ref 3.5–5.1)
Sodium: 142 mEq/L (ref 135–145)
Total Bilirubin: 0.3 mg/dL (ref 0.2–1.2)
Total Protein: 6.9 g/dL (ref 6.0–8.3)

## 2018-12-27 LAB — CBC WITH DIFFERENTIAL/PLATELET
Basophils Absolute: 0.1 10*3/uL (ref 0.0–0.1)
Basophils Relative: 1.1 % (ref 0.0–3.0)
Eosinophils Absolute: 0.2 10*3/uL (ref 0.0–0.7)
Eosinophils Relative: 2.6 % (ref 0.0–5.0)
HCT: 41.8 % (ref 36.0–46.0)
Hemoglobin: 13.5 g/dL (ref 12.0–15.0)
Lymphocytes Relative: 44.5 % (ref 12.0–46.0)
Lymphs Abs: 3 10*3/uL (ref 0.7–4.0)
MCHC: 32.3 g/dL (ref 30.0–36.0)
MCV: 81.4 fl (ref 78.0–100.0)
Monocytes Absolute: 0.3 10*3/uL (ref 0.1–1.0)
Monocytes Relative: 3.8 % (ref 3.0–12.0)
Neutro Abs: 3.2 10*3/uL (ref 1.4–7.7)
Neutrophils Relative %: 48 % (ref 43.0–77.0)
Platelets: 299 10*3/uL (ref 150.0–400.0)
RBC: 5.14 Mil/uL — ABNORMAL HIGH (ref 3.87–5.11)
RDW: 15.5 % (ref 11.5–15.5)
WBC: 6.7 10*3/uL (ref 4.0–10.5)

## 2018-12-27 LAB — LIPID PANEL
Cholesterol: 173 mg/dL (ref 0–200)
HDL: 50.4 mg/dL (ref >39.00)
NonHDL: 122.28
Total CHOL/HDL Ratio: 3
Triglycerides: 237 mg/dL — ABNORMAL HIGH (ref 0.0–149.0)
VLDL: 47.4 mg/dL — ABNORMAL HIGH (ref 0.0–40.0)

## 2018-12-27 LAB — POCT URINALYSIS DIPSTICK
Bilirubin, UA: NEGATIVE
Blood, UA: NEGATIVE
Glucose, UA: NEGATIVE
Ketones, UA: NEGATIVE
Leukocytes, UA: NEGATIVE
Nitrite, UA: NEGATIVE
Protein, UA: NEGATIVE
Spec Grav, UA: 1.025 (ref 1.010–1.025)
Urobilinogen, UA: 0.2 E.U./dL
pH, UA: 6 (ref 5.0–8.0)

## 2018-12-27 LAB — LDL CHOLESTEROL, DIRECT: Direct LDL: 72 mg/dL

## 2018-12-27 LAB — TSH: TSH: 1.96 u[IU]/mL (ref 0.35–4.50)

## 2018-12-27 LAB — HEMOGLOBIN A1C: Hgb A1c MFr Bld: 6.2 % (ref 4.6–6.5)

## 2018-12-27 NOTE — Progress Notes (Signed)
Patient presents to clinic today for annual exam.  Patient is fasting for labs.  Acute Concerns: Shoulder/ arm: - early march, gradually worsened,started as discomfort, more pain at night, hard to sleep, propped on pillow helps. Pain varies 2/10-7/10 . aching pain  Hot shower/movement in the morning helps. Morning soreness/ stiffness Pain with lifting arms, limited ROM due to pain  PRN tylenol, helps  Pain radiating into hands mainly at night when trying to sleep/ get comfortable in bed.  Denies numbness, weakness, tingling in arms.  No falls, MVA, or known injury to area    Urinary incontinence:  Hx of stress incontinence, now it is not just stress related. Unable to get the restroom at work, now wearing pads.Urinating 1/hr. Increased urinary urgency. Notes sometimes it will just start coming out without urge as well. Drinking 2-3 cups of coffee, green tea at dinner, water throughout day Denies painful urination, blood in urine, suprapubic pain   Chronic Issues: Hypertension:  -- Patient is currently on a regimen of Lisinopril-HCTZ 10-12.5 mg. Taking medication daily as prescribed. Denies lightheadedness, chest pain, palpitations. Checks BP at home on occasion.    BP Readings from Last 3 Encounters:  12/27/18 110/80  08/21/18 102/80  07/19/18 112/80   Hyperlipidemia: -- Patient is currently on regimen of Simvastatin 20 mg daily. Takes medications daily, denies leg cramps. No regular exercise but tries to walk more frequently at work. Tries to walk at home. Currently in wellness program   Hypothyroidism  -- Currently Rx'd Synthroid 100 mcg daily. Reports taking medication daily, denies any issues with medication. Denies issues of constipations.   Depression  -- Currently taking Lexapro 10 mg daily which has worked well for her previously.  Patient reports episodes of depressed mood. Recent loss of father and family dynamics. Situational stressors along with current pandemic  and political climate contributing to depressed mood.Good support system including spouse and son. Declines changing medication today. Denies SI/HI.  Health Maintenance: Immunizations -- UTD Colonoscopy -- Overdue. Order for screening colonoscopy placed. Mammogram -- UTD Hep C Screening -- Declines  Past Medical History:  Diagnosis Date  . Allergy   . Arthritis   . Asthma   . Cancer ([place])    Glioblastoma at 63 years old  . Depression   . Hyperlipidemia   . Hypertension   . Thyroid disease    hypothyroidism    Past Surgical History:  Procedure Laterality Date  . BRAIN SURGERY  63 yo  . CHOLECYSTECTOMY  1998  . EXPLORATORY LAPAROTOMY  1983    Current Outpatient Medications on File Prior to Visit  Medication Sig Dispense Refill  . acetaminophen (TYLENOL) 500 MG tablet Take 500 mg by mouth every 6 (six) hours as needed.    Marland Kitchen albuterol (PROVENTIL HFA;VENTOLIN HFA) 108 (90 Base) MCG/ACT inhaler Inhale 2 puffs into the lungs every 4 (four) hours as needed for wheezing (cough, shortness of breath or wheezing.). 1 Inhaler 2  . b complex vitamins tablet Take 1 tablet by mouth daily.    Marland Kitchen escitalopram (LEXAPRO) 10 MG tablet TAKE 1 TABLET BY MOUTH DAILY. 90 tablet 1  . lisinopril-hydrochlorothiazide (PRINZIDE,ZESTORETIC) 10-12.5 MG tablet TAKE 1/2 TABLET BY MOUTH DAILY. 45 tablet 1  . Omega-3 Fatty Acids (FISH OIL) 1000 MG CAPS Take 1 capsule (1,000 mg total) by mouth daily. 30 capsule 0  . simvastatin (ZOCOR) 20 MG tablet Take 1 tablet (20 mg total) by mouth at bedtime. Due for physical. Please schedule 90 tablet 0  .  SYNTHROID 100 MCG tablet TAKE 1 TABLET BY MOUTH DAILY BEFORE BREAKFAST. 90 tablet 1   No current facility-administered medications on file prior to visit.     No Known Allergies  Family History  Problem Relation Age of Onset  . COPD Mother   . Hypertension Father   . Dementia Father   . COPD Brother   . Colon cancer Neg Hx   . Esophageal cancer Neg Hx   . Rectal  cancer Neg Hx   . Stomach cancer Neg Hx     Social History   Socioeconomic History  . Marital status: Married    Spouse name: Not on file  . Number of children: 3  . Years of education: Not on file  . Highest education level: Not on file  Occupational History  . Occupation: front desk    Comment: [employer]  Social Needs  . Financial resource strain: Not on file  . Food insecurity    Worry: Not on file    Inability: Not on file  . Transportation needs    Medical: Not on file    Non-medical: Not on file  Tobacco Use  . Smoking status: Former Smoker    Packs/day: 1.00    Years: 10.00    Pack years: 10.00    Types: Cigarettes    Quit date: 11/22/1981    Years since quitting: 37.1  . Smokeless tobacco: Never Used  Substance and Sexual Activity  . Alcohol use: No  . Drug use: No  . Sexual activity: Yes  Lifestyle  . Physical activity    Days per week: Not on file    Minutes per session: Not on file  . Stress: Not on file  Relationships  . Social Herbalist on phone: Not on file    Gets together: Not on file    Attends religious service: Not on file    Active member of club or organization: Not on file    Attends meetings of clubs or organizations: Not on file    Relationship status: Not on file  . Intimate partner violence    Fear of current or ex partner: Not on file    Emotionally abused: Not on file    Physically abused: Not on file    Forced sexual activity: Not on file  Other Topics Concern  . Not on file  Social History Narrative  . Not on file    Review of Systems  Constitutional: Negative for fever and weight loss.  HENT: Negative for ear discharge, ear pain, hearing loss and tinnitus.   Eyes: Negative for blurred vision, double vision, photophobia and pain.  Respiratory: Negative for cough and shortness of breath.   Cardiovascular: Negative for chest pain and palpitations.  Gastrointestinal: Negative for  abdominal pain, blood in stool, constipation, diarrhea, heartburn, melena, nausea and vomiting.  Genitourinary: Positive for frequency and urgency. Negative for dysuria and hematuria.  Musculoskeletal: Negative for falls.  Neurological: Negative for dizziness, loss of consciousness and headaches.  Endo/Heme/Allergies: Negative for environmental allergies.  Psychiatric/Behavioral: Positive for depression (recent loss of father, family dynamics). Negative for hallucinations, substance abuse and suicidal ideas. The patient is not nervous/anxious and does not have insomnia.    BP 110/80   Pulse 81   Temp 98 F (36.7 C) (Skin)   Resp 16   Ht 5' 2.5" (1.588 m)   Wt 204 lb (92.5 kg)   SpO2 98%   BMI 36.72  kg/m   Physical Exam Vitals signs reviewed.  HENT:     Head: Normocephalic and atraumatic.     Right Ear: Tympanic membrane, ear canal and external ear normal.     Left Ear: Tympanic membrane, ear canal and external ear normal.     Nose: Nose normal. No mucosal edema.     Mouth/Throat:     Pharynx: Uvula midline. No oropharyngeal exudate or posterior oropharyngeal erythema.  Eyes:     Conjunctiva/sclera: Conjunctivae normal.     Pupils: Pupils are equal, round, and reactive to light.  Neck:     Musculoskeletal: Neck supple.     Thyroid: No thyromegaly.  Cardiovascular:     Rate and Rhythm: Normal rate and regular rhythm.     Heart sounds: Normal heart sounds.  Pulmonary:     Effort: Pulmonary effort is normal. No respiratory distress.     Breath sounds: Normal breath sounds. No wheezing or rales.  Abdominal:     General: Bowel sounds are normal. There is no distension.     Palpations: Abdomen is soft. There is no mass.     Tenderness: There is no abdominal tenderness. There is no guarding or rebound.  Musculoskeletal:     Right shoulder: She exhibits decreased range of motion (Due to pain).     Left shoulder: She exhibits decreased range of motion (Due to pain).   Lymphadenopathy:     Cervical: No cervical adenopathy.  Skin:    General: Skin is warm and dry.     Findings: No rash.  Neurological:     Mental Status: She is alert and oriented to person, place, and time.     Cranial Nerves: No cranial nerve deficit.     Recent Results (from the past 2160 hour(s))  CBC w/Diff     Status: Abnormal   Collection Time: 12/27/18  9:34 AM  Result Value Ref Range   WBC 6.7 4.0 - 10.5 K/uL   RBC 5.14 (H) 3.87 - 5.11 Mil/uL   Hemoglobin 13.5 12.0 - 15.0 g/dL   HCT 41.8 36.0 - 46.0 %   MCV 81.4 78.0 - 100.0 fl   MCHC 32.3 30.0 - 36.0 g/dL   RDW 15.5 11.5 - 15.5 %   Platelets 299.0 150.0 - 400.0 K/uL   Neutrophils Relative % 48.0 43.0 - 77.0 %   Lymphocytes Relative 44.5 12.0 - 46.0 %   Monocytes Relative 3.8 3.0 - 12.0 %   Eosinophils Relative 2.6 0.0 - 5.0 %   Basophils Relative 1.1 0.0 - 3.0 %   Neutro Abs 3.2 1.4 - 7.7 K/uL   Lymphs Abs 3.0 0.7 - 4.0 K/uL   Monocytes Absolute 0.3 0.1 - 1.0 K/uL   Eosinophils Absolute 0.2 0.0 - 0.7 K/uL   Basophils Absolute 0.1 0.0 - 0.1 K/uL  Comp Met (CMET)     Status: None   Collection Time: 12/27/18  9:34 AM  Result Value Ref Range   Sodium 142 135 - 145 mEq/L   Potassium 4.4 3.5 - 5.1 mEq/L   Chloride 106 96 - 112 mEq/L   CO2 26 19 - 32 mEq/L   Glucose, Bld 94 70 - 99 mg/dL   BUN 18 6 - 23 mg/dL   Creatinine, Ser 0.79 0.40 - 1.20 mg/dL   Total Bilirubin 0.3 0.2 - 1.2 mg/dL   Alkaline Phosphatase 84 39 - 117 U/L   AST 13 0 - 37 U/L   ALT 18 0 - 35 U/L  Total Protein 6.9 6.0 - 8.3 g/dL   Albumin 4.7 3.5 - 5.2 g/dL   Calcium 9.4 8.4 - 10.5 mg/dL   GFR 73.28 >60.00 mL/min  Lipid Profile     Status: Abnormal   Collection Time: 12/27/18  9:34 AM  Result Value Ref Range   Cholesterol 173 0 - 200 mg/dL    Comment: ATP III Classification       Desirable:  < 200 mg/dL               Borderline High:  200 - 239 mg/dL          High:  > = 240 mg/dL   Triglycerides 237.0 (H) 0.0 - 149.0 mg/dL    Comment:  Normal:  <150 mg/dLBorderline High:  150 - 199 mg/dL   HDL 50.40 >39.00 mg/dL   VLDL 47.4 (H) 0.0 - 40.0 mg/dL   Total CHOL/HDL Ratio 3     Comment:                Men          Women1/2 Average Risk     3.4          3.3Average Risk          5.0          4.42X Average Risk          9.6          7.13X Average Risk          15.0          11.0                       NonHDL 122.28     Comment: NOTE:  Non-HDL goal should be 30 mg/dL higher than patient's LDL goal (i.e. LDL goal of < 70 mg/dL, would have non-HDL goal of < 100 mg/dL)  TSH     Status: None   Collection Time: 12/27/18  9:34 AM  Result Value Ref Range   TSH 1.96 0.35 - 4.50 uIU/mL  HgB A1c     Status: None   Collection Time: 12/27/18  9:34 AM  Result Value Ref Range   Hgb A1c MFr Bld 6.2 4.6 - 6.5 %    Comment: Glycemic Control Guidelines for People with Diabetes:Non Diabetic:  <6%Goal of Therapy: <7%Additional Action Suggested:  >8%   Rheumatoid Factor     Status: None   Collection Time: 12/27/18  9:34 AM  Result Value Ref Range   Rhuematoid fact SerPl-aCnc <49 <67 IU/mL  Cyclic citrul peptide antibody, IgG (QUEST)     Status: None   Collection Time: 12/27/18  9:34 AM  Result Value Ref Range   Cyclic Citrullin Peptide Ab <16 UNITS    Comment: Reference Range Negative:            <20 Weak Positive:       20-39 Moderate Positive:   40-59 Strong Positive:     >59 .   LDL cholesterol, direct     Status: None   Collection Time: 12/27/18  9:34 AM  Result Value Ref Range   Direct LDL 72.0 mg/dL    Comment: Optimal:  <100 mg/dLNear or Above Optimal:  100-129 mg/dLBorderline High:  130-159 mg/dLHigh:  160-189 mg/dLVery High:  >190 mg/dL  POCT urinalysis dipstick     Status: Normal   Collection Time: 12/27/18  9:38 AM  Result Value Ref Range   Color, UA yellow  Clarity, UA clear    Glucose, UA Negative Negative   Bilirubin, UA negative    Ketones, UA negative    Spec Grav, UA 1.025 1.010 - 1.025   Blood, UA negative    pH,  UA 6.0 5.0 - 8.0   Protein, UA Negative Negative   Urobilinogen, UA 0.2 0.2 or 1.0 E.U./dL   Nitrite, UA negative    Leukocytes, UA Negative Negative   Appearance     Odor     Assessment/Plan: 1. Visit for preventive health examination Depression screen negative. Health Maintenance reviewed. Preventive schedule discussed and handout given in AVS. Will obtain fasting labs today.  - CBC w/Diff - Comp Met (CMET) - Lipid Profile - HgB A1c  2. Essential hypertension Stable. Asymptomatic. Continue current regimen. Repeat labs today. - Comp Met (CMET) - HgB A1c  3. Hypothyroidism, unspecified type Taking medication. Repeat TSH today. - TSH  4. Hyperlipidemia, unspecified hyperlipidemia type Continue Statin. Dietary and exercise recommendations reviewed. Repeat fasting labs today. - Lipid Profile - HgB A1c  5. Moderate episode of recurrent major depressive disorder ([place]) Some deterioration due to recent stressors. Discussed counseling and change to pharmacotherapy. She declines at present. Will monitor closely.   6. Joint stiffness 7. Cervicalgia Seems more likely OA but giving level of stiffness daily will check rheum panel to rule this out. If negative, will obtain imaging to help guide treatment. Discussed OTC turmeric supplement. - Rheumatoid Factor - Cyclic citrul peptide antibody, IgG (QUEST)  8. Mixed stress and urge urinary incontinence UA unremarkable. Referral to Urology placed for further assessment to make sure most appropriate treatment is given.  - POCT urinalysis dipstick   Leeanne Rio, PA-C

## 2018-12-27 NOTE — Patient Instructions (Signed)
Please go to the lab for blood work.   Our office will call you with your results unless you have chosen to receive results via MyChart.  If your blood work is normal we will follow-up each year for physicals and as scheduled for chronic medical problems.  If anything is abnormal we will treat accordingly and get you in for a follow-up.   Preventive Care 40-64 Years Old, Female Preventive care refers to visits with your health care provider and lifestyle choices that can promote health and wellness. This includes:  A yearly physical exam. This may also be called an annual well check.  Regular dental visits and eye exams.  Immunizations.  Screening for certain conditions.  Healthy lifestyle choices, such as eating a healthy diet, getting regular exercise, not using drugs or products that contain nicotine and tobacco, and limiting alcohol use. What can I expect for my preventive care visit? Physical exam Your health care provider will check your:  Height and weight. This may be used to calculate body mass index (BMI), which tells if you are at a healthy weight.  Heart rate and blood pressure.  Skin for abnormal spots. Counseling Your health care provider may ask you questions about your:  Alcohol, tobacco, and drug use.  Emotional well-being.  Home and relationship well-being.  Sexual activity.  Eating habits.  Work and work environment.  Method of birth control.  Menstrual cycle.  Pregnancy history. What immunizations do I need?  Influenza (flu) vaccine  This is recommended every year. Tetanus, diphtheria, and pertussis (Tdap) vaccine  You may need a Td booster every 10 years. Varicella (chickenpox) vaccine  You may need this if you have not been vaccinated. Zoster (shingles) vaccine  You may need this after age 60. Measles, mumps, and rubella (MMR) vaccine  You may need at least one dose of MMR if you were born in 1957 or later. You may also need a  second dose. Pneumococcal conjugate (PCV13) vaccine  You may need this if you have certain conditions and were not previously vaccinated. Pneumococcal polysaccharide (PPSV23) vaccine  You may need one or two doses if you smoke cigarettes or if you have certain conditions. Meningococcal conjugate (MenACWY) vaccine  You may need this if you have certain conditions. Hepatitis A vaccine  You may need this if you have certain conditions or if you travel or work in places where you may be exposed to hepatitis A. Hepatitis B vaccine  You may need this if you have certain conditions or if you travel or work in places where you may be exposed to hepatitis B. Haemophilus influenzae type b (Hib) vaccine  You may need this if you have certain conditions. Human papillomavirus (HPV) vaccine  If recommended by your health care provider, you may need three doses over 6 months. You may receive vaccines as individual doses or as more than one vaccine together in one shot (combination vaccines). Talk with your health care provider about the risks and benefits of combination vaccines. What tests do I need? Blood tests  Lipid and cholesterol levels. These may be checked every 5 years, or more frequently if you are over 50 years old.  Hepatitis C test.  Hepatitis B test. Screening  Lung cancer screening. You may have this screening every year starting at age 55 if you have a 30-pack-year history of smoking and currently smoke or have quit within the past 15 years.  Colorectal cancer screening. All adults should have this screening starting   at age 59 and continuing until age 23. Your health care provider may recommend screening at age 34 if you are at increased risk. You will have tests every 1-10 years, depending on your results and the type of screening test.  Diabetes screening. This is done by checking your blood sugar (glucose) after you have not eaten for a while (fasting). You may have this done  every 1-3 years.  Mammogram. This may be done every 1-2 years. Talk with your health care provider about when you should start having regular mammograms. This may depend on whether you have a family history of breast cancer.  BRCA-related cancer screening. This may be done if you have a family history of breast, ovarian, tubal, or peritoneal cancers.  Pelvic exam and Pap test. This may be done every 3 years starting at age 6. Starting at age 65, this may be done every 5 years if you have a Pap test in combination with an HPV test. Other tests  Sexually transmitted disease (STD) testing.  Bone density scan. This is done to screen for osteoporosis. You may have this scan if you are at high risk for osteoporosis. Follow these instructions at home: Eating and drinking  Eat a diet that includes fresh fruits and vegetables, whole grains, lean protein, and low-fat dairy.  Take vitamin and mineral supplements as recommended by your health care provider.  Do not drink alcohol if: ? Your health care provider tells you not to drink. ? You are pregnant, may be pregnant, or are planning to become pregnant.  If you drink alcohol: ? Limit how much you have to 0-1 drink a day. ? Be aware of how much alcohol is in your drink. In the [place] one drink equals one 12 oz bottle of beer (355 mL), one 5 oz glass of wine (148 mL), or one 1 oz glass of hard liquor (44 mL). Lifestyle  Take daily care of your teeth and gums.  Stay active. Exercise for at least 30 minutes on 5 or more days each week.  Do not use any products that contain nicotine or tobacco, such as cigarettes, e-cigarettes, and chewing tobacco. If you need help quitting, ask your health care provider.  If you are sexually active, practice safe sex. Use a condom or other form of birth control (contraception) in order to prevent pregnancy and STIs (sexually transmitted infections).  If told by your health care provider, take low-dose aspirin  daily starting at age 65. What's next?  Visit your health care provider once a year for a well check visit.  Ask your health care provider how often you should have your eyes and teeth checked.  Stay up to date on all vaccines. This information is not intended to replace advice given to you by your health care provider. Make sure you discuss any questions you have with your health care provider. Document Released: 07/04/2015 Document Revised: 02/16/2018 Document Reviewed: 02/16/2018 Elsevier Patient Education  2020 [employer]. .

## 2018-12-28 LAB — RHEUMATOID FACTOR: Rheumatoid fact SerPl-aCnc: <14 IU/mL (ref <14)

## 2018-12-28 LAB — CYCLIC CITRUL PEPTIDE ANTIBODY, IGG: Cyclic Citrullin Peptide Ab: <16 UNITS

## 2019-01-03 ENCOUNTER — Encounter: Payer: No Typology Code available for payment source | Admitting: Gastroenterology

## 2019-01-09 ENCOUNTER — Telehealth: Payer: Self-pay | Admitting: Gastroenterology

## 2019-01-09 NOTE — Telephone Encounter (Signed)

## 2019-01-10 ENCOUNTER — Other Ambulatory Visit: Payer: Self-pay

## 2019-01-10 ENCOUNTER — Encounter: Payer: Self-pay | Admitting: Gastroenterology

## 2019-01-10 ENCOUNTER — Ambulatory Visit (AMBULATORY_SURGERY_CENTER): Payer: No Typology Code available for payment source | Admitting: Gastroenterology

## 2019-01-10 VITALS — BP 107/59 | HR 80 | Temp 97.3°F | Resp 15 | Ht 63.0 in | Wt 205.0 lb

## 2019-01-10 DIAGNOSIS — Z1211 Encounter for screening for malignant neoplasm of colon: Secondary | ICD-10-CM | POA: Diagnosis not present

## 2019-01-10 DIAGNOSIS — D123 Benign neoplasm of transverse colon: Secondary | ICD-10-CM

## 2019-01-10 DIAGNOSIS — K5730 Diverticulosis of large intestine without perforation or abscess without bleeding: Secondary | ICD-10-CM

## 2019-01-10 DIAGNOSIS — Z86010 Personal history of colonic polyps: Secondary | ICD-10-CM | POA: Diagnosis not present

## 2019-01-10 DIAGNOSIS — D175 Benign lipomatous neoplasm of intra-abdominal organs: Secondary | ICD-10-CM

## 2019-01-10 DIAGNOSIS — D122 Benign neoplasm of ascending colon: Secondary | ICD-10-CM

## 2019-01-10 DIAGNOSIS — K640 First degree hemorrhoids: Secondary | ICD-10-CM

## 2019-01-10 MED ORDER — SODIUM CHLORIDE 0.9 % IV SOLN: 500.0000 mL | Freq: Once | INTRAVENOUS | Status: DC

## 2019-01-10 NOTE — Progress Notes (Signed)
Pt's states no medical or surgical changes since previsit or office visit.  Temp taken by CW VS taken by JB 

## 2019-01-10 NOTE — Progress Notes (Signed)
Called to room to assist during endoscopic procedure.  Patient ID and intended procedure confirmed with present staff. Received instructions for my participation in the procedure from the performing physician.  

## 2019-01-10 NOTE — Progress Notes (Signed)
PT taken to PACU. Monitors in place. VSS. Report given to RN. 

## 2019-01-10 NOTE — Op Note (Signed)
[facility] Patient Name: Heidi Christensen Procedure Date: 01/10/2019 9:44 AM MRN: [id number] Endoscopist: Gerrit Heck , MD Age: 63 Referring MD:  Date of Birth: Aug 22, 1955 Gender: Female Account #: [id number] Procedure:                Colonoscopy Indications:              Screening for colorectal malignant neoplasm. Last                            colonoscopy was 10 years ago, 2010, and notable for                            2 small Hyperplastic Polyps. Otherwise, no active                            GI sxs and no known FHx of CRC. Medicines:                Monitored Anesthesia Care Procedure:                Pre-Anesthesia Assessment:                           - Prior to the procedure, a History and Physical                            was performed, and patient medications and                            allergies were reviewed. The patient's tolerance of                            previous anesthesia was also reviewed. The risks                            and benefits of the procedure and the sedation                            options and risks were discussed with the patient.                            All questions were answered, and informed consent                            was obtained. Prior Anticoagulants: The patient has                            taken no previous anticoagulant or antiplatelet                            agents. ASA Grade Assessment: III - A patient with                            severe systemic disease. After reviewing the risks  and benefits, the patient was deemed in                            satisfactory condition to undergo the procedure.                           After obtaining informed consent, the colonoscope                            was passed under direct vision. Throughout the                            procedure, the patient's blood pressure, pulse, and                            oxygen saturations were  monitored continuously. The                            Colonoscope was introduced through the anus and                            advanced to the the terminal ileum. The colonoscopy                            was performed without difficulty. The patient                            tolerated the procedure well. The quality of the                            bowel preparation was adequate. The terminal ileum,                            ileocecal valve, appendiceal orifice, and rectum                            were photographed. Scope In: 9:53:19 AM Scope Out: 10:16:37 AM Scope Withdrawal Time: 0 hours 19 minutes 26 seconds  Total Procedure Duration: 0 hours 23 minutes 18 seconds  Findings:                 The perianal and digital rectal examinations were                            normal.                           Three sessile polyps were found in the transverse                            colon (1) and ascending colon (2). The polyps were                            3 to 6 mm in size. These polyps were removed with a  cold snare. Resection and retrieval were complete.                            Estimated blood loss was minimal.                           There was a small lipoma, in the cecum.                           One, small, 5 mm submucosal nodule was found in the                            transverse colon. The overlying mucosa was                            otherwise normal appearing. Bite-on-bite biopsies                            were taken with a cold forceps for histology. Area                            on the contralateral wall was tattooed with an                            injection of 2 cc of Spot (carbon black). Estimated                            blood loss was minimal.                           Multiple small and large-mouthed diverticula were                            found in the sigmoid colon.                           Non-bleeding  internal hemorrhoids were found during                            retroflexion. The hemorrhoids were small.                           The terminal ileum appeared normal. Complications:            No immediate complications. Estimated Blood Loss:     Estimated blood loss was minimal. Impression:               - Three 3 to 6 mm polyps in the transverse colon                            and in the ascending colon, removed with a cold                            snare. Resected and retrieved.                           -  Small lipoma in the cecum.                           - Submucosal nodule in the transverse colon.                            Biopsied. Tattooed.                           - Diverticulosis in the sigmoid colon.                           - Non-bleeding internal hemorrhoids.                           - The examined portion of the ileum was normal. Recommendation:           - Patient has a contact number available for                            emergencies. The signs and symptoms of potential                            delayed complications were discussed with the                            patient. Return to normal activities tomorrow.                            Written discharge instructions were provided to the                            patient.                           - Resume previous diet.                           - Continue present medications.                           - Await pathology results.                           - Repeat colonoscopy for surveillance based on                            pathology results.                           - Use fiber, for example Citrucel, Fibercon, Konsyl                            or Metamucil.                           - Return to GI clinic PRN.                           -  Internal hemorrhoids were noted on this study and                            may be amenable to hemorrhoid band ligation. If you                            are  interested in further treatment of these                            hemorrhoids with band ligation, please contact my                            clinic to set up an appointment for evaluation and                            treatment. Gerrit Heck, MD 01/10/2019 10:25:01 AM

## 2019-01-10 NOTE — Patient Instructions (Signed)
Please read handouts provided. Continue present medications. Await pathology results. Return to GI clinic as needed. Use fiber, for example Citrucel, Fibercon, Konsyl, or Metamucil.      YOU HAD AN ENDOSCOPIC PROCEDURE TODAY AT [facility]:   Refer to the procedure report that was given to you for any specific questions about what was found during the examination.  If the procedure report does not answer your questions, please call your gastroenterologist to clarify.  If you requested that your care partner not be given the details of your procedure findings, then the procedure report has been included in a sealed envelope for you to review at your convenience later.  YOU SHOULD EXPECT: Some feelings of bloating in the abdomen. Passage of more gas than usual.  Walking can help get rid of the air that was put into your GI tract during the procedure and reduce the bloating. If you had a lower endoscopy (such as a colonoscopy or flexible sigmoidoscopy) you may notice spotting of blood in your stool or on the toilet paper. If you underwent a bowel prep for your procedure, you may not have a normal bowel movement for a few days.  Please Note:  You might notice some irritation and congestion in your nose or some drainage.  This is from the oxygen used during your procedure.  There is no need for concern and it should clear up in a day or so.  SYMPTOMS TO REPORT IMMEDIATELY:   Following lower endoscopy (colonoscopy or flexible sigmoidoscopy):  Excessive amounts of blood in the stool  Significant tenderness or worsening of abdominal pains  Swelling of the abdomen that is new, acute  Fever of 100F or higher    For urgent or emergent issues, a gastroenterologist can be reached at any hour by calling [phone].   DIET:  We do recommend a small meal at first, but then you may proceed to your regular diet.  Drink plenty of fluids but you should avoid alcoholic beverages for 24  hours.  ACTIVITY:  You should plan to take it easy for the rest of today and you should NOT DRIVE or use heavy machinery until tomorrow (because of the sedation medicines used during the test).    FOLLOW UP: Our staff will call the number listed on your records 48-72 hours following your procedure to check on you and address any questions or concerns that you may have regarding the information given to you following your procedure. If we do not reach you, we will leave a message.  We will attempt to reach you two times.  During this call, we will ask if you have developed any symptoms of COVID 19. If you develop any symptoms (ie: fever, flu-like symptoms, shortness of breath, cough etc.) before then, please call [phone].  If you test positive for Covid 19 in the 2 weeks post procedure, please call and report this information to [place].    If any biopsies were taken you will be contacted by phone or by letter within the next 1-3 weeks.  Please call us at [phone] if you have not heard about the biopsies in 3 weeks.    SIGNATURES/CONFIDENTIALITY: You and/or your care partner have signed paperwork which will be entered into your electronic medical record.  These signatures attest to the fact that that the information above on your After Visit Summary has been reviewed and is understood.  Full responsibility of the confidentiality of this discharge information lies with  you and/or your care-partner.

## 2019-01-12 ENCOUNTER — Telehealth: Payer: Self-pay

## 2019-01-12 ENCOUNTER — Encounter: Payer: Self-pay | Admitting: Gastroenterology

## 2019-01-12 NOTE — Telephone Encounter (Signed)
Covid-19 screening questions   Do you now or have you had a fever in the last 14 days? No.  Do you have any respiratory symptoms of shortness of breath or cough now or in the last 14 days? No.  Do you have any family members or close contacts with diagnosed or suspected Covid-19 in the past 14 days? No.  Have you been tested for Covid-19 and found to be positive? No.      Follow up Call-  Call back number 01/10/2019  Post procedure Call Back phone  # [phone]  Permission to leave phone message Yes  Some recent data might be hidden     Patient questions:  Do you have a fever, pain , or abdominal swelling? No. Pain Score  0 *  Have you tolerated food without any problems? Yes.    Have you been able to return to your normal activities? Yes.    Do you have any questions about your discharge instructions: Diet   No. Medications  No. Follow up visit  No.  Do you have questions or concerns about your Care? No.  Spoke to pt.'s husband.  Pt. On her way to work.  Actions: * If pain score is 4 or above: No action needed, pain <4.

## 2019-01-15 ENCOUNTER — Other Ambulatory Visit: Payer: Self-pay | Admitting: Physician Assistant

## 2019-01-15 DIAGNOSIS — I10 Essential (primary) hypertension: Secondary | ICD-10-CM

## 2019-01-15 DIAGNOSIS — E039 Hypothyroidism, unspecified: Secondary | ICD-10-CM

## 2019-01-15 MED FILL — SYNTHROID 100 MCG TABLET: 100 | 90 days supply | Qty: 90 | Fill #0

## 2019-01-15 MED FILL — LISINOPRIL-HCTZ 10-12.5 MG: 10-12.5 | 30 days supply | Qty: 15 | Fill #0

## 2019-01-15 MED FILL — ESCITALOPRAM 10 MG TABLET: 10 | 90 days supply | Qty: 90 | Fill #1

## 2019-02-02 ENCOUNTER — Encounter: Payer: Self-pay | Admitting: Physician Assistant

## 2019-02-02 MED FILL — CIPROFLOXACIN HCL 250 MG TA: 250 | 7 days supply | Qty: 14 | Fill #0

## 2019-02-18 MED FILL — LISINOPRIL-HCTZ 10-12.5 MG: 10-12.5 | 30 days supply | Qty: 15 | Fill #1

## 2019-02-28 ENCOUNTER — Other Ambulatory Visit: Payer: Self-pay | Admitting: Physician Assistant

## 2019-02-28 MED FILL — SIMVASTATIN 20 MG TABLET: 20 | 90 days supply | Qty: 90 | Fill #0

## 2019-03-23 MED FILL — LISINOPRIL-HCTZ 10-12.5 MG: 10-12.5 | 30 days supply | Qty: 15 | Fill #2

## 2019-04-04 MED FILL — MYRBETRIQ ER 50 MG TABLET: 50 | 30 days supply | Qty: 30 | Fill #0

## 2019-04-11 ENCOUNTER — Other Ambulatory Visit: Payer: Self-pay | Admitting: Physician Assistant

## 2019-04-11 MED FILL — SYNTHROID 100 MCG TABLET: 100 | 90 days supply | Qty: 90 | Fill #1

## 2019-04-11 MED FILL — ESCITALOPRAM 10 MG TABLET: 10 | 90 days supply | Qty: 90 | Fill #0

## 2019-04-23 MED FILL — LISINOPRIL-HCTZ 10-12.5 MG: 10-12.5 | 30 days supply | Qty: 15 | Fill #3

## 2019-05-02 MED FILL — MYRBETRIQ ER 50 MG TABLET: 50 | 30 days supply | Qty: 30 | Fill #1

## 2019-06-03 MED FILL — MYRBETRIQ ER 50 MG TABLET: 50 | 30 days supply | Qty: 30 | Fill #2

## 2019-06-04 ENCOUNTER — Other Ambulatory Visit: Payer: Self-pay | Admitting: Physician Assistant

## 2019-06-05 ENCOUNTER — Other Ambulatory Visit: Payer: Self-pay | Admitting: Physician Assistant

## 2019-06-05 MED FILL — SIMVASTATIN 20 MG TABLET: 20 | 90 days supply | Qty: 90 | Fill #0

## 2019-06-05 NOTE — Telephone Encounter (Signed)
Denied.  Provider not at this office.

## 2019-06-19 MED FILL — LISINOPRIL-HCTZ 10-12.5 MG: 10-12.5 | 30 days supply | Qty: 15 | Fill #4

## 2019-06-26 DIAGNOSIS — N3946 Mixed incontinence: Secondary | ICD-10-CM | POA: Diagnosis not present

## 2019-06-26 DIAGNOSIS — R350 Frequency of micturition: Secondary | ICD-10-CM | POA: Diagnosis not present

## 2019-06-26 DIAGNOSIS — N390 Urinary tract infection, site not specified: Secondary | ICD-10-CM | POA: Diagnosis not present

## 2019-06-26 DIAGNOSIS — B9620 Unspecified Escherichia coli [E. coli] as the cause of diseases classified elsewhere: Secondary | ICD-10-CM | POA: Diagnosis not present

## 2019-06-28 MED FILL — NITROFURANTOIN MONO-MCR 100: 100 | 7 days supply | Qty: 14 | Fill #0

## 2019-07-04 MED FILL — NITROFURANTOIN MCR 100 MG C: 100 | 7 days supply | Qty: 14 | Fill #0

## 2019-07-09 MED FILL — OXYBUTYNIN CL ER 10 MG TAB: 10 | 30 days supply | Qty: 30 | Fill #0

## 2019-07-09 MED FILL — ESCITALOPRAM 10 MG TABLET: 10 | 90 days supply | Qty: 90 | Fill #1

## 2019-07-17 ENCOUNTER — Other Ambulatory Visit: Payer: Self-pay | Admitting: Physician Assistant

## 2019-07-17 DIAGNOSIS — E039 Hypothyroidism, unspecified: Secondary | ICD-10-CM

## 2019-07-17 MED FILL — SYNTHROID 100 MCG TABLET: 100 | 90 days supply | Qty: 90 | Fill #0

## 2019-07-17 MED FILL — LISINOPRIL-HCTZ 10-12.5 MG: 10-12.5 | 30 days supply | Qty: 15 | Fill #5

## 2019-08-02 MED FILL — OXYBUTYNIN CL ER 10 MG TAB: 10 | 30 days supply | Qty: 30 | Fill #1

## 2019-08-16 ENCOUNTER — Other Ambulatory Visit: Payer: Self-pay | Admitting: Physician Assistant

## 2019-08-16 DIAGNOSIS — I10 Essential (primary) hypertension: Secondary | ICD-10-CM

## 2019-08-16 MED FILL — LISINOPRIL-HCTZ 10-12.5 MG: 10-12.5 | 30 days supply | Qty: 15 | Fill #0

## 2019-08-21 DIAGNOSIS — R350 Frequency of micturition: Secondary | ICD-10-CM | POA: Diagnosis not present

## 2019-08-21 DIAGNOSIS — N3946 Mixed incontinence: Secondary | ICD-10-CM | POA: Diagnosis not present

## 2019-08-22 ENCOUNTER — Encounter: Payer: Self-pay | Admitting: Physician Assistant

## 2019-08-22 ENCOUNTER — Ambulatory Visit: Payer: 59 | Admitting: Physician Assistant

## 2019-08-22 ENCOUNTER — Other Ambulatory Visit: Payer: Self-pay

## 2019-08-22 VITALS — BP 100/64 | HR 76 | Temp 98.0°F | Resp 16 | Ht 63.0 in | Wt 209.0 lb

## 2019-08-22 DIAGNOSIS — I10 Essential (primary) hypertension: Secondary | ICD-10-CM | POA: Diagnosis not present

## 2019-08-22 DIAGNOSIS — Z1159 Encounter for screening for other viral diseases: Secondary | ICD-10-CM

## 2019-08-22 LAB — COMPREHENSIVE METABOLIC PANEL
ALT: 19 U/L (ref 0–35)
AST: 15 U/L (ref 0–37)
Albumin: 4.3 g/dL (ref 3.5–5.2)
Alkaline Phosphatase: 73 U/L (ref 39–117)
BUN: 22 mg/dL (ref 6–23)
CO2: 25 mEq/L (ref 19–32)
Calcium: 9.5 mg/dL (ref 8.4–10.5)
Chloride: 105 mEq/L (ref 96–112)
Creatinine, Ser: 0.83 mg/dL (ref 0.40–1.20)
GFR: 69.08 mL/min (ref >60.00)
Glucose, Bld: 99 mg/dL (ref 70–99)
Potassium: 4.8 mEq/L (ref 3.5–5.1)
Sodium: 139 mEq/L (ref 135–145)
Total Bilirubin: 0.5 mg/dL (ref 0.2–1.2)
Total Protein: 6.7 g/dL (ref 6.0–8.3)

## 2019-08-22 NOTE — Addendum Note (Signed)
Addended by: Jodell Cipro on: 08/22/2019 08:37 AM   Modules accepted: Orders

## 2019-08-22 NOTE — Progress Notes (Signed)
Patient presents to clinic today for follow-up of hypertension. Patient is currently on a regimen of lisinopril-HCTZ 10-12.5 mg daily. Endorses taking medications as directed. Is walking up/down stairs at work and doing resistance bands at [employer]. Diet is overall well-balanced.   Past Medical History:  Diagnosis Date  . Allergy   . Arthritis   . Asthma   . Cancer ([place])    Glioblastoma at 64 years old  . Depression   . Hyperlipidemia   . Hypertension   . Thyroid disease    hypothyroidism    Current Outpatient Medications on File Prior to Visit  Medication Sig Dispense Refill  . acetaminophen (TYLENOL) 500 MG tablet Take 500 mg by mouth every 6 (six) hours as needed.    Marland Kitchen albuterol (PROVENTIL HFA;VENTOLIN HFA) 108 (90 Base) MCG/ACT inhaler Inhale 2 puffs into the lungs every 4 (four) hours as needed for wheezing (cough, shortness of breath or wheezing.). 1 Inhaler 2  . b complex vitamins tablet Take 1 tablet by mouth daily.    Marland Kitchen escitalopram (LEXAPRO) 10 MG tablet TAKE 1 TABLET BY MOUTH DAILY. 90 tablet 1  . lisinopril-hydrochlorothiazide (ZESTORETIC) 10-12.5 MG tablet TAKE 1/2 TABLET BY MOUTH DAILY. 15 tablet 0  . oxybutynin (DITROPAN-XL) 10 MG 24 hr tablet Take 10 mg by mouth daily.    . simvastatin (ZOCOR) 20 MG tablet TAKE 1 TABLET BY MOUTH AT BEDTIME 90 tablet 0  . SYNTHROID 100 MCG tablet TAKE 1 TABLET BY MOUTH DAILY BEFORE BREAKFAST. 90 tablet 1   No current facility-administered medications on file prior to visit.    No Known Allergies  Family History  Problem Relation Age of Onset  . COPD Mother   . Hypertension Father   . Dementia Father   . COPD Brother   . Colon cancer Neg Hx   . Esophageal cancer Neg Hx   . Rectal cancer Neg Hx   . Stomach cancer Neg Hx     Social History   Socioeconomic History  . Marital status: Married    Spouse name: Not on file  . Number of children: 3  . Years of education: Not on file  . Highest education level: Not on file    Occupational History  . Occupation: front desk    Comment: [employer]  Tobacco Use  . Smoking status: Former Smoker    Packs/day: 1.00    Years: 10.00    Pack years: 10.00    Types: Cigarettes    Quit date: 11/22/1981    Years since quitting: 37.7  . Smokeless tobacco: Never Used  Substance and Sexual Activity  . Alcohol use: No  . Drug use: No  . Sexual activity: Yes  Other Topics Concern  . Not on file  Social History Narrative  . Not on file   Social Determinants of Health   Financial Resource Strain:   . Difficulty of Paying Living Expenses: Not on file  Food Insecurity:   . Worried About Charity fundraiser in the Last Year: Not on file  . Ran Out of Food in the Last Year: Not on file  Transportation Needs:   . Lack of Transportation (Medical): Not on file  . Lack of Transportation (Non-Medical): Not on file  Physical Activity:   . Days of Exercise per Week: Not on file  . Minutes of Exercise per Session: Not on file  Stress:   . Feeling of Stress : Not on file  Social Connections:   .  Frequency of Communication with Friends and Family: Not on file  . Frequency of Social Gatherings with Friends and Family: Not on file  . Attends Religious Services: Not on file  . Active Member of Clubs or Organizations: Not on file  . Attends Archivist Meetings: Not on file  . Marital Status: Not on file   Review of Systems - See HPI.  All other ROS are negative.  BP 100/64   Pulse 76   Temp 98 F (36.7 C) (Temporal)   Resp 16   Ht 5\' 3"  (1.6 m)   Wt 209 lb (94.8 kg)   SpO2 96%   BMI 37.02 kg/m   Physical Exam Vitals reviewed.  Constitutional:      Appearance: Normal appearance.  HENT:     Head: Normocephalic and atraumatic.  Cardiovascular:     Rate and Rhythm: Normal rate and regular rhythm.     Pulses: Normal pulses.     Heart sounds: Normal heart sounds.  Pulmonary:     Effort: Pulmonary effort is normal.   Musculoskeletal:     Cervical back: Neck supple.  Neurological:     Mental Status: She is alert and oriented to person, place, and time. Mental status is at baseline.  Psychiatric:        Mood and Affect: Mood normal.     Assessment/Plan: 1. Essential hypertension Doing well overall. Keep up with good diet and exercise. Goal of 150 minutes per week. Will have her check home BP 2-3 x weekly over the next couple of weeks as BP borderline low today (asymptomatic). If staying on the lower end of normal will reduce her regimen.   This visit occurred during the SARS-CoV-2 public health emergency.  Safety protocols were in place, including screening questions prior to the visit, additional usage of staff PPE, and extensive cleaning of exam room while observing appropriate contact time as indicated for disinfecting solutions.     Leeanne Rio, PA-C

## 2019-08-22 NOTE — Patient Instructions (Signed)
Please go to the lab today for blood work.  I will call you with your results. We will alter treatment regimen(s) if indicated by your results.   Please keep up with current regimen for now. I want you to check BP 3 times a week and record. If you note BP consistently < 110 (top number) or < 70 (bottom number), let me know and I would want to reduce medication doses.   Let me know if you guys have success getting Shanon Brow his COVID vaccine.    Hypertension, Adult Hypertension is another name for high blood pressure. High blood pressure forces your heart to work harder to pump blood. This can cause problems over time. There are two numbers in a blood pressure reading. There is a top number (systolic) over a bottom number (diastolic). It is best to have a blood pressure that is below 120/80. Healthy choices can help lower your blood pressure, or you may need medicine to help lower it. What are the causes? The cause of this condition is not known. Some conditions may be related to high blood pressure. What increases the risk?  Smoking.  Having type 2 diabetes mellitus, high cholesterol, or both.  Not getting enough exercise or physical activity.  Being overweight.  Having too much fat, sugar, calories, or salt (sodium) in your diet.  Drinking too much alcohol.  Having long-term (chronic) kidney disease.  Having a family history of high blood pressure.  Age. Risk increases with age.  Race. You may be at higher risk if you are African American.  Gender. Men are at higher risk than women before age 83. After age 31, women are at higher risk than men.  Having obstructive sleep apnea.  Stress. What are the signs or symptoms?  High blood pressure may not cause symptoms. Very high blood pressure (hypertensive crisis) may cause: ? Headache. ? Feelings of worry or nervousness (anxiety). ? Shortness of breath. ? Nosebleed. ? A feeling of being sick to your stomach (nausea). ?  Throwing up (vomiting). ? Changes in how you see. ? Very bad chest pain. ? Seizures. How is this treated?  This condition is treated by making healthy lifestyle changes, such as: ? Eating healthy foods. ? Exercising more. ? Drinking less alcohol.  Your health care provider may prescribe medicine if lifestyle changes are not enough to get your blood pressure under control, and if: ? Your top number is above 130. ? Your bottom number is above 80.  Your personal target blood pressure may vary. Follow these instructions at home: Eating and drinking   If told, follow the DASH eating plan. To follow this plan: ? Fill one half of your plate at each meal with fruits and vegetables. ? Fill one fourth of your plate at each meal with whole grains. Whole grains include whole-wheat pasta, brown rice, and whole-grain bread. ? Eat or drink low-fat dairy products, such as skim milk or low-fat yogurt. ? Fill one fourth of your plate at each meal with low-fat (lean) proteins. Low-fat proteins include fish, chicken without skin, eggs, beans, and tofu. ? Avoid fatty meat, cured and processed meat, or chicken with skin. ? Avoid pre-made or processed food.  Eat less than 1,500 mg of salt each day.  Do not drink alcohol if: ? Your doctor tells you not to drink. ? You are pregnant, may be pregnant, or are planning to become pregnant.  If you drink alcohol: ? Limit how much you use to:  0-1 drink a day for women.  0-2 drinks a day for men. ? Be aware of how much alcohol is in your drink. In the [place] one drink equals one 12 oz bottle of beer (355 mL), one 5 oz glass of wine (148 mL), or one 1 oz glass of hard liquor (44 mL). Lifestyle   Work with your doctor to stay at a healthy weight or to lose weight. Ask your doctor what the best weight is for you.  Get at least 30 minutes of exercise most days of the week. This may include walking, swimming, or biking.  Get at least 30 minutes of  exercise that strengthens your muscles (resistance exercise) at least 3 days a week. This may include lifting weights or doing Pilates.  Do not use any products that contain nicotine or tobacco, such as cigarettes, e-cigarettes, and chewing tobacco. If you need help quitting, ask your doctor.  Check your blood pressure at home as told by your doctor.  Keep all follow-up visits as told by your doctor. This is important. Medicines  Take over-the-counter and prescription medicines only as told by your doctor. Follow directions carefully.  Do not skip doses of blood pressure medicine. The medicine does not work as well if you skip doses. Skipping doses also puts you at risk for problems.  Ask your doctor about side effects or reactions to medicines that you should watch for. Contact a doctor if you:  Think you are having a reaction to the medicine you are taking.  Have headaches that keep coming back (recurring).  Feel dizzy.  Have swelling in your ankles.  Have trouble with your vision. Get help right away if you:  Get a very bad headache.  Start to feel mixed up (confused).  Feel weak or numb.  Feel faint.  Have very bad pain in your: ? Chest. ? Belly (abdomen).  Throw up more than once.  Have trouble breathing. Summary  Hypertension is another name for high blood pressure.  High blood pressure forces your heart to work harder to pump blood.  For most people, a normal blood pressure is less than 120/80.  Making healthy choices can help lower blood pressure. If your blood pressure does not get lower with healthy choices, you may need to take medicine. This information is not intended to replace advice given to you by your health care provider. Make sure you discuss any questions you have with your health care provider. Document Revised: 02/15/2018 Document Reviewed: 02/15/2018 Elsevier Patient Education  2020 [employer].

## 2019-08-23 LAB — HEPATITIS C ANTIBODY
Hepatitis C Ab: NONREACTIVE
SIGNAL TO CUT-OFF: 0.01 (ref <1.00)

## 2019-08-26 MED FILL — OXYBUTYNIN CL ER 10 MG TAB: 10 | 90 days supply | Qty: 90 | Fill #0

## 2019-09-03 ENCOUNTER — Other Ambulatory Visit: Payer: Self-pay | Admitting: Physician Assistant

## 2019-09-03 MED FILL — SIMVASTATIN 20 MG TABLET: 20 | 90 days supply | Qty: 90 | Fill #0

## 2019-09-04 MED FILL — OXYBUTYNIN CL ER 10 MG TAB: 10 | 90 days supply | Qty: 90 | Fill #0

## 2019-09-20 ENCOUNTER — Other Ambulatory Visit: Payer: Self-pay | Admitting: Physician Assistant

## 2019-09-20 DIAGNOSIS — I10 Essential (primary) hypertension: Secondary | ICD-10-CM

## 2019-09-20 MED FILL — LISINOPRIL-HCTZ 10-12.5 MG: 10-12.5 | 30 days supply | Qty: 15 | Fill #0

## 2019-09-21 ENCOUNTER — Other Ambulatory Visit: Payer: Self-pay | Admitting: Physician Assistant

## 2019-09-21 DIAGNOSIS — I10 Essential (primary) hypertension: Secondary | ICD-10-CM

## 2019-09-21 MED ORDER — LISINOPRIL-HYDROCHLOROTHIAZIDE 10-12.5 MG PO TABS: 0.5000 | ORAL_TABLET | Freq: Every day | ORAL | 1 refills | Status: DC

## 2019-10-01 DIAGNOSIS — H2513 Age-related nuclear cataract, bilateral: Secondary | ICD-10-CM | POA: Diagnosis not present

## 2019-10-10 DIAGNOSIS — Z01818 Encounter for other preprocedural examination: Secondary | ICD-10-CM | POA: Diagnosis not present

## 2019-10-10 DIAGNOSIS — H2511 Age-related nuclear cataract, right eye: Secondary | ICD-10-CM | POA: Diagnosis not present

## 2019-10-15 ENCOUNTER — Other Ambulatory Visit: Payer: Self-pay | Admitting: Physician Assistant

## 2019-10-15 MED FILL — LISINOPRIL-HCTZ 10-12.5 MG: 10-12.5 | 30 days supply | Qty: 15 | Fill #1

## 2019-10-15 MED FILL — ESCITALOPRAM 10 MG TABLET: 10 | 90 days supply | Qty: 90 | Fill #0

## 2019-10-15 MED FILL — SYNTHROID 100 MCG TABLET: 100 | 90 days supply | Qty: 90 | Fill #1

## 2019-10-24 MED FILL — PREDNISOLONE AC 1% EYE DROP: 1 | 21 days supply | Qty: 5 | Fill #0

## 2019-10-24 MED FILL — OFLOXACIN 0.3% EYE DROPS: 0.3 | 25 days supply | Qty: 5 | Fill #0

## 2019-10-29 DIAGNOSIS — H25811 Combined forms of age-related cataract, right eye: Secondary | ICD-10-CM | POA: Diagnosis not present

## 2019-10-29 DIAGNOSIS — H2511 Age-related nuclear cataract, right eye: Secondary | ICD-10-CM | POA: Diagnosis not present

## 2019-11-06 ENCOUNTER — Encounter: Payer: Self-pay | Admitting: Physician Assistant

## 2019-11-06 DIAGNOSIS — J4520 Mild intermittent asthma, uncomplicated: Secondary | ICD-10-CM

## 2019-11-06 MED ORDER — ALBUTEROL SULFATE HFA 108 (90 BASE) MCG/ACT IN AERS: 2.0000 | INHALATION_SPRAY | RESPIRATORY_TRACT | 3 refills | Status: AC | PRN

## 2019-11-06 MED FILL — ALBUTEROL SULFATE HFA 108 (: 108 (90 BAS | 25 days supply | Qty: 18 | Fill #0

## 2019-11-20 MED FILL — LISINOPRIL-HCTZ 10-12.5 MG: 10-12.5 | 30 days supply | Qty: 15 | Fill #2

## 2019-11-22 MED FILL — OFLOXACIN 0.3% EYE DROPS: 0.3 | 25 days supply | Qty: 5 | Fill #0

## 2019-11-22 MED FILL — PREDNISOLONE AC 1% EYE DROP: 1 | 51 days supply | Qty: 5 | Fill #0

## 2019-11-30 ENCOUNTER — Other Ambulatory Visit: Payer: Self-pay | Admitting: Physician Assistant

## 2019-11-30 DIAGNOSIS — H25812 Combined forms of age-related cataract, left eye: Secondary | ICD-10-CM | POA: Diagnosis not present

## 2019-11-30 DIAGNOSIS — H2512 Age-related nuclear cataract, left eye: Secondary | ICD-10-CM | POA: Diagnosis not present

## 2019-12-18 MED FILL — LISINOPRIL-HCTZ 10-12.5 MG: 10-12.5 | 30 days supply | Qty: 15 | Fill #3

## 2020-01-14 ENCOUNTER — Other Ambulatory Visit: Payer: Self-pay | Admitting: Physician Assistant

## 2020-01-14 DIAGNOSIS — Z1231 Encounter for screening mammogram for malignant neoplasm of breast: Secondary | ICD-10-CM

## 2020-01-16 ENCOUNTER — Other Ambulatory Visit: Payer: Self-pay | Admitting: Emergency Medicine

## 2020-01-16 DIAGNOSIS — I10 Essential (primary) hypertension: Secondary | ICD-10-CM

## 2020-01-16 MED ORDER — LISINOPRIL-HYDROCHLOROTHIAZIDE 10-12.5 MG PO TABS: 0.5000 | ORAL_TABLET | Freq: Every day | ORAL | 1 refills | Status: DC

## 2020-01-16 MED FILL — ESCITALOPRAM 10 MG TABLET: 10 | 90 days supply | Qty: 90 | Fill #1

## 2020-01-16 MED FILL — LISINOPRIL-HCTZ 10-12.5 MG: 10-12.5 | 90 days supply | Qty: 45 | Fill #0

## 2020-01-21 ENCOUNTER — Other Ambulatory Visit: Payer: Self-pay

## 2020-01-21 ENCOUNTER — Ambulatory Visit
Admission: RE | Admit: 2020-01-21 | Discharge: 2020-01-21 | Disposition: A | Discharge status: Home / Self Care | Payer: 59 | Source: Ambulatory Visit

## 2020-01-21 DIAGNOSIS — Z1231 Encounter for screening mammogram for malignant neoplasm of breast: Secondary | ICD-10-CM

## 2020-01-23 ENCOUNTER — Encounter: Payer: Self-pay | Admitting: Emergency Medicine

## 2020-01-28 ENCOUNTER — Other Ambulatory Visit: Payer: Self-pay | Admitting: Physician Assistant

## 2020-01-28 DIAGNOSIS — E039 Hypothyroidism, unspecified: Secondary | ICD-10-CM

## 2020-01-29 ENCOUNTER — Other Ambulatory Visit: Payer: Self-pay | Admitting: Physician Assistant

## 2020-01-29 MED FILL — SYNTHROID 100 MCG TABLET: 100 | 90 days supply | Qty: 90 | Fill #0

## 2020-02-06 ENCOUNTER — Encounter: Payer: 59 | Admitting: Physician Assistant

## 2020-02-11 ENCOUNTER — Ambulatory Visit (INDEPENDENT_AMBULATORY_CARE_PROVIDER_SITE_OTHER): Payer: 59 | Admitting: Physician Assistant

## 2020-02-11 ENCOUNTER — Other Ambulatory Visit: Payer: Self-pay

## 2020-02-11 ENCOUNTER — Encounter: Payer: Self-pay | Admitting: Physician Assistant

## 2020-02-11 ENCOUNTER — Other Ambulatory Visit (HOSPITAL_COMMUNITY)
Admission: RE | Admit: 2020-02-11 | Discharge: 2020-02-11 | Disposition: A | Discharge status: Home / Self Care | Payer: 59 | Source: Ambulatory Visit | Attending: Physician Assistant | Admitting: Physician Assistant

## 2020-02-11 VITALS — BP 110/70 | HR 70 | Temp 97.9°F | Resp 16 | Ht 63.0 in | Wt 204.0 lb

## 2020-02-11 DIAGNOSIS — E785 Hyperlipidemia, unspecified: Secondary | ICD-10-CM

## 2020-02-11 DIAGNOSIS — E039 Hypothyroidism, unspecified: Secondary | ICD-10-CM

## 2020-02-11 DIAGNOSIS — F331 Major depressive disorder, recurrent, moderate: Secondary | ICD-10-CM | POA: Diagnosis not present

## 2020-02-11 DIAGNOSIS — M8589 Other specified disorders of bone density and structure, multiple sites: Secondary | ICD-10-CM

## 2020-02-11 DIAGNOSIS — Z0000 Encounter for general adult medical examination without abnormal findings: Secondary | ICD-10-CM

## 2020-02-11 DIAGNOSIS — I10 Essential (primary) hypertension: Secondary | ICD-10-CM | POA: Diagnosis not present

## 2020-02-11 DIAGNOSIS — Z124 Encounter for screening for malignant neoplasm of cervix: Secondary | ICD-10-CM | POA: Insufficient documentation

## 2020-02-11 LAB — CBC WITH DIFFERENTIAL/PLATELET
Basophils Absolute: 0.1 10*3/uL (ref 0.0–0.1)
Basophils Relative: 0.8 % (ref 0.0–3.0)
Eosinophils Absolute: 0.1 10*3/uL (ref 0.0–0.7)
Eosinophils Relative: 2 % (ref 0.0–5.0)
HCT: 41.5 % (ref 36.0–46.0)
Hemoglobin: 13.5 g/dL (ref 12.0–15.0)
Lymphocytes Relative: 38.6 % (ref 12.0–46.0)
Lymphs Abs: 2.9 10*3/uL (ref 0.7–4.0)
MCHC: 32.5 g/dL (ref 30.0–36.0)
MCV: 80.6 fl (ref 78.0–100.0)
Monocytes Absolute: 0.3 10*3/uL (ref 0.1–1.0)
Monocytes Relative: 4.7 % (ref 3.0–12.0)
Neutro Abs: 4 10*3/uL (ref 1.4–7.7)
Neutrophils Relative %: 53.9 % (ref 43.0–77.0)
Platelets: 263 10*3/uL (ref 150.0–400.0)
RBC: 5.15 Mil/uL — ABNORMAL HIGH (ref 3.87–5.11)
RDW: 15.3 % (ref 11.5–15.5)
WBC: 7.5 10*3/uL (ref 4.0–10.5)

## 2020-02-11 LAB — TSH: TSH: 1.32 u[IU]/mL (ref 0.35–4.50)

## 2020-02-11 LAB — COMPREHENSIVE METABOLIC PANEL
ALT: 21 U/L (ref 0–35)
AST: 16 U/L (ref 0–37)
Albumin: 4.5 g/dL (ref 3.5–5.2)
Alkaline Phosphatase: 61 U/L (ref 39–117)
BUN: 22 mg/dL (ref 6–23)
CO2: 26 mEq/L (ref 19–32)
Calcium: 9.7 mg/dL (ref 8.4–10.5)
Chloride: 105 mEq/L (ref 96–112)
Creatinine, Ser: 0.86 mg/dL (ref 0.40–1.20)
GFR: 66.21 mL/min (ref >60.00)
Glucose, Bld: 97 mg/dL (ref 70–99)
Potassium: 3.9 mEq/L (ref 3.5–5.1)
Sodium: 142 mEq/L (ref 135–145)
Total Bilirubin: 0.5 mg/dL (ref 0.2–1.2)
Total Protein: 6.6 g/dL (ref 6.0–8.3)

## 2020-02-11 LAB — LIPID PANEL
Cholesterol: 116 mg/dL (ref 0–200)
HDL: 39 mg/dL — ABNORMAL LOW (ref >39.00)
LDL Cholesterol: 45 mg/dL (ref 0–99)
NonHDL: 76.72
Total CHOL/HDL Ratio: 3
Triglycerides: 158 mg/dL — ABNORMAL HIGH (ref 0.0–149.0)
VLDL: 31.6 mg/dL (ref 0.0–40.0)

## 2020-02-11 LAB — HEMOGLOBIN A1C: Hgb A1c MFr Bld: 6.1 % (ref 4.6–6.5)

## 2020-02-11 NOTE — Progress Notes (Signed)
Patient presents to clinic today for annual exam.  Patient is fasting for labs.  Has joined [employer] in the past month. Has lost 10 pounds. Is watching portion sizes and calories. Has significantly cut back on red meat.   Recently had cataract surgery in both eyes. No complications.   Acute Concerns: Denies acute concerns at today's visit.   Chronic Issues: Hypertension -- Patient is currently on a regimen of lisinopril-HCTZ. Endorses taking medication daily as directed and tolerating well. Patient denies chest pain, palpitations, lightheadedness, dizziness, vision changes or frequent headaches.  BP Readings from Last 3 Encounters:  02/11/20 110/70  08/22/19 100/64  01/10/19 (!) 107/59   Hyperlipidemia -- Dietary changes as noted above. Is keeping active daily. Endorses taking her Simvastatin 20 mg daily.   Hypothyroidism -- Endorses taking her Synthroid 100 mcg daily. Denies change to mood, heat tolerance, energy or bowel habits.   MDD -- Taking Lexapro 10 mg daily. Notes doing fantastic overall. Sleeping well. Denies change to appetite or energy levels.   Health Maintenance: Immunizations -- up-to-date Colon Cancer Screening -- up-to-date Mammogram -- up-to-date. PAP -- Due. Requesting today.  Bone Density -- Due giving age [age]. Order to be placed.    Past Medical History:  Diagnosis Date  . Allergy   . Arthritis   . Asthma   . Cancer ([place])    Glioblastoma at 64 years old  . Depression   . Hyperlipidemia   . Hypertension   . Thyroid disease    hypothyroidism    Past Surgical History:  Procedure Laterality Date  . BRAIN SURGERY  64 yo  . CHOLECYSTECTOMY  1998  . EXPLORATORY LAPAROTOMY  1983    Current Outpatient Medications on File Prior to Visit  Medication Sig Dispense Refill  . acetaminophen (TYLENOL) 500 MG tablet Take 500 mg by mouth every 6 (six) hours as needed.    Marland Kitchen albuterol (VENTOLIN HFA) 108 (90 Base) MCG/ACT inhaler Inhale 2 puffs into  the lungs every 4 (four) hours as needed for wheezing (cough, shortness of breath or wheezing.). 18 g 3  . b complex vitamins tablet Take 1 tablet by mouth daily.    Marland Kitchen escitalopram (LEXAPRO) 10 MG tablet TAKE 1 TABLET BY MOUTH DAILY. 90 tablet 1  . lisinopril-hydrochlorothiazide (ZESTORETIC) 10-12.5 MG tablet Take 0.5 tablets by mouth daily. 45 tablet 1  . oxybutynin (DITROPAN-XL) 10 MG 24 hr tablet Take 10 mg by mouth daily.    . simvastatin (ZOCOR) 20 MG tablet TAKE 1 TABLET BY MOUTH AT BEDTIME 90 tablet 0  . SYNTHROID 100 MCG tablet TAKE 1 TABLET BY MOUTH DAILY BEFORE BREAKFAST. 90 tablet 1   No current facility-administered medications on file prior to visit.    No Known Allergies  Family History  Problem Relation Age of Onset  . COPD Mother   . Hypertension Father   . Dementia Father   . COPD Brother   . Colon cancer Neg Hx   . Esophageal cancer Neg Hx   . Rectal cancer Neg Hx   . Stomach cancer Neg Hx     Social History   Socioeconomic History  . Marital status: Married    Spouse name: Not on file  . Number of children: 3  . Years of education: Not on file  . Highest education level: Not on file  Occupational History  . Occupation: front desk    Comment: [employer]  Tobacco Use  . Smoking  status: Former Smoker    Packs/day: 1.00    Years: 10.00    Pack years: 10.00    Types: Cigarettes    Quit date: 11/22/1981    Years since quitting: 38.2  . Smokeless tobacco: Never Used  Vaping Use  . Vaping Use: Never used  Substance and Sexual Activity  . Alcohol use: No  . Drug use: No  . Sexual activity: Yes  Other Topics Concern  . Not on file  Social History Narrative  . Not on file   Social Determinants of Health   Financial Resource Strain:   . Difficulty of Paying Living Expenses: Not on file  Food Insecurity:   . Worried About Charity fundraiser in the Last Year: Not on file  . Ran Out of Food in the Last Year: Not on file   Transportation Needs:   . Lack of Transportation (Medical): Not on file  . Lack of Transportation (Non-Medical): Not on file  Physical Activity:   . Days of Exercise per Week: Not on file  . Minutes of Exercise per Session: Not on file  Stress:   . Feeling of Stress : Not on file  Social Connections:   . Frequency of Communication with Friends and Family: Not on file  . Frequency of Social Gatherings with Friends and Family: Not on file  . Attends Religious Services: Not on file  . Active Member of Clubs or Organizations: Not on file  . Attends Archivist Meetings: Not on file  . Marital Status: Not on file  Intimate Partner Violence:   . Fear of Current or Ex-Partner: Not on file  . Emotionally Abused: Not on file  . Physically Abused: Not on file  . Sexually Abused: Not on file    Review of Systems  Constitutional: Negative for fever and weight loss.  HENT: Negative for ear discharge, ear pain, hearing loss and tinnitus.   Eyes: Negative for blurred vision, double vision, photophobia and pain.  Respiratory: Negative for cough and shortness of breath.   Cardiovascular: Negative for chest pain and palpitations.  Gastrointestinal: Negative for abdominal pain, blood in stool, constipation, diarrhea, heartburn, melena, nausea and vomiting.  Genitourinary: Negative for dysuria, flank pain, frequency, hematuria and urgency.  Musculoskeletal: Negative for falls.  Neurological: Negative for dizziness, loss of consciousness and headaches.  Endo/Heme/Allergies: Negative for environmental allergies.  Psychiatric/Behavioral: Negative for depression, hallucinations, substance abuse and suicidal ideas. The patient is not nervous/anxious and does not have insomnia.    BP 110/70   Pulse 70   Temp 97.9 F (36.6 C) (Temporal)   Resp 16   Ht 5\' 3"  (1.6 m)   Wt 204 lb (92.5 kg)   SpO2 98%   BMI 36.14 kg/m   Physical Exam Vitals reviewed.  HENT:     Head: Normocephalic and  atraumatic.     Right Ear: Tympanic membrane, ear canal and external ear normal.     Left Ear: Tympanic membrane, ear canal and external ear normal.     Nose: Nose normal. No mucosal edema.     Mouth/Throat:     Pharynx: Uvula midline. No oropharyngeal exudate or posterior oropharyngeal erythema.  Eyes:     Conjunctiva/sclera: Conjunctivae normal.     Pupils: Pupils are equal, round, and reactive to light.  Neck:     Thyroid: No thyromegaly.  Cardiovascular:     Rate and Rhythm: Normal rate and regular rhythm.     Heart sounds: Normal heart  sounds.  Pulmonary:     Effort: Pulmonary effort is normal. No respiratory distress.     Breath sounds: Normal breath sounds. No wheezing or rales.  Abdominal:     General: Bowel sounds are normal. There is no distension.     Palpations: Abdomen is soft. There is no mass.     Tenderness: There is no abdominal tenderness. There is no guarding or rebound.  Musculoskeletal:     Cervical back: Neck supple.  Lymphadenopathy:     Cervical: No cervical adenopathy.  Skin:    General: Skin is warm and dry.     Findings: No rash.  Neurological:     Mental Status: She is alert and oriented to person, place, and time.     Cranial Nerves: No cranial nerve deficit.    Assessment/Plan: 1. Visit for preventive health examination Depression screen negative. Health Maintenance reviewed. Preventive schedule discussed and handout given in AVS. Will obtain fasting labs today. - CBC with Differential/Platelet - Comprehensive metabolic panel  2. Cervical cancer screening Examination today unremarkable. PAP obtained. If negative this will be last screening PAP giving age. Recommend yearly examination and mammography to be continued.  - Cytology - PAP( [place])  3. Osteopenia of multiple sites Prior history. No recent DEXA. Order placed.  - DG Bone Density; Future  4. Moderate episode of recurrent major depressive disorder ([place]) Doing very well with  current regimen. Continue same. Follow-up every 6 months.   5. Hypothyroidism, unspecified type Repeat TSH today. Continue levothyroxine. - TSH  6. Essential hypertension Normotensive. Asymptomatic. Continue dietary changes and exercise. Repeat fasting labs today.  - Comprehensive metabolic panel - Lipid panel  7. Hyperlipidemia, unspecified hyperlipidemia type Continue diet and exercise. Patient commended on her weight loss. Repeat fasting labs today.  - Comprehensive metabolic panel - Lipid panel - Hemoglobin A1c   This visit occurred during the SARS-CoV-2 public health emergency.  Safety protocols were in place, including screening questions prior to the visit, additional usage of staff PPE, and extensive cleaning of exam room while observing appropriate contact time as indicated for disinfecting solutions.    Leeanne Rio, PA-C

## 2020-02-11 NOTE — Patient Instructions (Signed)
Please go to the lab for blood work.   Our office will call you with your results unless you have chosen to receive results via MyChart.  If your blood work is normal we will follow-up each year for physicals and as scheduled for chronic medical problems.  If anything is abnormal we will treat accordingly and get you in for a follow-up.  You will be contacted to schedule your bone density test to screen for osteoporosis.    Preventive Care 30 Years and Older, Female Preventive care refers to lifestyle choices and visits with your health care provider that can promote health and wellness. This includes:  A yearly physical exam. This is also called an annual well check.  Regular dental and eye exams.  Immunizations.  Screening for certain conditions.  Healthy lifestyle choices, such as diet and exercise. What can I expect for my preventive care visit? Physical exam Your health care provider will check:  Height and weight. These may be used to calculate body mass index (BMI), which is a measurement that tells if you are at a healthy weight.  Heart rate and blood pressure.  Your skin for abnormal spots. Counseling Your health care provider may ask you questions about:  Alcohol, tobacco, and drug use.  Emotional well-being.  Home and relationship well-being.  Sexual activity.  Eating habits.  History of falls.  Memory and ability to understand (cognition).  Work and work Statistician.  Pregnancy and menstrual history. What immunizations do I need?  Influenza (flu) vaccine  This is recommended every year. Tetanus, diphtheria, and pertussis (Tdap) vaccine  You may need a Td booster every 10 years. Varicella (chickenpox) vaccine  You may need this vaccine if you have not already been vaccinated. Zoster (shingles) vaccine  You may need this after age 60. Pneumococcal conjugate (PCV13) vaccine  One dose is recommended after age 77. Pneumococcal polysaccharide  (PPSV23) vaccine  One dose is recommended after age 64. Measles, mumps, and rubella (MMR) vaccine  You may need at least one dose of MMR if you were born in 1957 or later. You may also need a second dose. Meningococcal conjugate (MenACWY) vaccine  You may need this if you have certain conditions. Hepatitis A vaccine  You may need this if you have certain conditions or if you travel or work in places where you may be exposed to hepatitis A. Hepatitis B vaccine  You may need this if you have certain conditions or if you travel or work in places where you may be exposed to hepatitis B. Haemophilus influenzae type b (Hib) vaccine  You may need this if you have certain conditions. You may receive vaccines as individual doses or as more than one vaccine together in one shot (combination vaccines). Talk with your health care provider about the risks and benefits of combination vaccines. What tests do I need? Blood tests  Lipid and cholesterol levels. These may be checked every 5 years, or more frequently depending on your overall health.  Hepatitis C test.  Hepatitis B test. Screening  Lung cancer screening. You may have this screening every year starting at age 66 if you have a 30-pack-year history of smoking and currently smoke or have quit within the past 15 years.  Colorectal cancer screening. All adults should have this screening starting at age 62 and continuing until age 4. Your health care provider may recommend screening at age 21 if you are at increased risk. You will have tests every 1-10 years, depending  on your results and the type of screening test.  Diabetes screening. This is done by checking your blood sugar (glucose) after you have not eaten for a while (fasting). You may have this done every 1-3 years.  Mammogram. This may be done every 1-2 years. Talk with your health care provider about how often you should have regular mammograms.  BRCA-related cancer screening.  This may be done if you have a family history of breast, ovarian, tubal, or peritoneal cancers. Other tests  Sexually transmitted disease (STD) testing.  Bone density scan. This is done to screen for osteoporosis. You may have this done starting at age 67. Follow these instructions at home: Eating and drinking  Eat a diet that includes fresh fruits and vegetables, whole grains, lean protein, and low-fat dairy products. Limit your intake of foods with high amounts of sugar, saturated fats, and salt.  Take vitamin and mineral supplements as recommended by your health care provider.  Do not drink alcohol if your health care provider tells you not to drink.  If you drink alcohol: ? Limit how much you have to 0-1 drink a day. ? Be aware of how much alcohol is in your drink. In the [place] one drink equals one 12 oz bottle of beer (355 mL), one 5 oz glass of wine (148 mL), or one 1 oz glass of hard liquor (44 mL). Lifestyle  Take daily care of your teeth and gums.  Stay active. Exercise for at least 30 minutes on 5 or more days each week.  Do not use any products that contain nicotine or tobacco, such as cigarettes, e-cigarettes, and chewing tobacco. If you need help quitting, ask your health care provider.  If you are sexually active, practice safe sex. Use a condom or other form of protection in order to prevent STIs (sexually transmitted infections).  Talk with your health care provider about taking a low-dose aspirin or statin. What's next?  Go to your health care provider once a year for a well check visit.  Ask your health care provider how often you should have your eyes and teeth checked.  Stay up to date on all vaccines. This information is not intended to replace advice given to you by your health care provider. Make sure you discuss any questions you have with your health care provider. Document Revised: 06/01/2018 Document Reviewed: 06/01/2018 Elsevier Patient Education   2020 [employer].

## 2020-02-12 LAB — CYTOLOGY - PAP
Comment: NEGATIVE
Diagnosis: NEGATIVE
High risk HPV: NEGATIVE

## 2020-03-10 ENCOUNTER — Other Ambulatory Visit: Payer: Self-pay | Admitting: Physician Assistant

## 2020-03-10 MED FILL — SIMVASTATIN 20 MG TABLET: 20 | 90 days supply | Qty: 90 | Fill #0

## 2020-04-15 ENCOUNTER — Other Ambulatory Visit: Payer: Self-pay | Admitting: Physician Assistant

## 2020-04-15 MED FILL — ESCITALOPRAM 10 MG TABLET: 10 | 90 days supply | Qty: 90 | Fill #0

## 2020-04-15 MED FILL — LISINOPRIL-HCTZ 10-12.5 MG: 10-12.5 | 90 days supply | Qty: 45 | Fill #1

## 2020-04-28 MED FILL — SYNTHROID 100 MCG TABLET: 100 | 90 days supply | Qty: 90 | Fill #1

## 2020-06-05 ENCOUNTER — Other Ambulatory Visit: Payer: Self-pay | Admitting: Physician Assistant

## 2020-06-05 MED FILL — SIMVASTATIN 20 MG TABLET: 20 | 90 days supply | Qty: 90 | Fill #0

## 2020-06-05 MED FILL — OXYBUTYNIN CL ER 10 MG TAB: 10 | 90 days supply | Qty: 90 | Fill #3

## 2020-07-10 ENCOUNTER — Ambulatory Visit (INDEPENDENT_AMBULATORY_CARE_PROVIDER_SITE_OTHER): Payer: 59 | Admitting: Physician Assistant

## 2020-07-10 ENCOUNTER — Other Ambulatory Visit: Payer: Self-pay | Admitting: Physician Assistant

## 2020-07-10 ENCOUNTER — Encounter: Payer: Self-pay | Admitting: Physician Assistant

## 2020-07-10 ENCOUNTER — Other Ambulatory Visit: Payer: Self-pay

## 2020-07-10 VITALS — BP 108/72 | HR 89 | Temp 98.0°F | Resp 16 | Wt 171.4 lb

## 2020-07-10 DIAGNOSIS — F325 Major depressive disorder, single episode, in full remission: Secondary | ICD-10-CM

## 2020-07-10 DIAGNOSIS — E039 Hypothyroidism, unspecified: Secondary | ICD-10-CM | POA: Diagnosis not present

## 2020-07-10 DIAGNOSIS — E785 Hyperlipidemia, unspecified: Secondary | ICD-10-CM

## 2020-07-10 DIAGNOSIS — I10 Essential (primary) hypertension: Secondary | ICD-10-CM | POA: Diagnosis not present

## 2020-07-10 LAB — COMPREHENSIVE METABOLIC PANEL
ALT: 16 U/L (ref 0–35)
AST: 16 U/L (ref 0–37)
Albumin: 4.6 g/dL (ref 3.5–5.2)
Alkaline Phosphatase: 70 U/L (ref 39–117)
BUN: 22 mg/dL (ref 6–23)
CO2: 25 mEq/L (ref 19–32)
Calcium: 9.7 mg/dL (ref 8.4–10.5)
Chloride: 106 mEq/L (ref 96–112)
Creatinine, Ser: 0.87 mg/dL (ref 0.40–1.20)
GFR: 69.88 mL/min (ref >60.00)
Glucose, Bld: 128 mg/dL — ABNORMAL HIGH (ref 70–99)
Potassium: 3.6 mEq/L (ref 3.5–5.1)
Sodium: 138 mEq/L (ref 135–145)
Total Bilirubin: 0.5 mg/dL (ref 0.2–1.2)
Total Protein: 7 g/dL (ref 6.0–8.3)

## 2020-07-10 LAB — CBC WITH DIFFERENTIAL/PLATELET
Basophils Absolute: 0 10*3/uL (ref 0.0–0.1)
Basophils Relative: 0.5 % (ref 0.0–3.0)
Eosinophils Absolute: 0.1 10*3/uL (ref 0.0–0.7)
Eosinophils Relative: 1.2 % (ref 0.0–5.0)
HCT: 41.6 % (ref 36.0–46.0)
Hemoglobin: 13.7 g/dL (ref 12.0–15.0)
Lymphocytes Relative: 34.5 % (ref 12.0–46.0)
Lymphs Abs: 2.5 10*3/uL (ref 0.7–4.0)
MCHC: 32.9 g/dL (ref 30.0–36.0)
MCV: 80.2 fl (ref 78.0–100.0)
Monocytes Absolute: 0.3 10*3/uL (ref 0.1–1.0)
Monocytes Relative: 3.9 % (ref 3.0–12.0)
Neutro Abs: 4.4 10*3/uL (ref 1.4–7.7)
Neutrophils Relative %: 59.9 % (ref 43.0–77.0)
Platelets: 271 10*3/uL (ref 150.0–400.0)
RBC: 5.18 Mil/uL — ABNORMAL HIGH (ref 3.87–5.11)
RDW: 15.8 % — ABNORMAL HIGH (ref 11.5–15.5)
WBC: 7.3 10*3/uL (ref 4.0–10.5)

## 2020-07-10 LAB — LIPID PANEL
Cholesterol: 127 mg/dL (ref 0–200)
HDL: 45 mg/dL (ref >39.00)
LDL Cholesterol: 54 mg/dL (ref 0–99)
NonHDL: 82.43
Total CHOL/HDL Ratio: 3
Triglycerides: 142 mg/dL (ref 0.0–149.0)
VLDL: 28.4 mg/dL (ref 0.0–40.0)

## 2020-07-10 LAB — TSH: TSH: 0.44 u[IU]/mL (ref 0.35–4.50)

## 2020-07-10 MED ORDER — LISINOPRIL 10 MG PO TABS: 10.0000 mg | ORAL_TABLET | Freq: Every day | ORAL | 1 refills | Status: DC

## 2020-07-10 MED FILL — LISINOPRIL 10 MG TABS: 10 | 30 days supply | Qty: 30 | Fill #0

## 2020-07-10 NOTE — Progress Notes (Signed)
Patient presents to clinic today to review medications and discuss possible weaning from some of her medicines given her recent weight loss.  Patient recently joined [employer] and has lost 20 pounds since starting.  Has been on a regimen of lisinopril-hydrochlorothiazide for blood pressure and simvastatin for cholesterol. Patient denies chest pain, palpitations,  dizziness, vision changes or frequent headaches.  Does note some occasional lightheadedness with taking her blood pressure medication.   Past Medical History:  Diagnosis Date  . Allergy   . Arthritis   . Asthma   . Cancer ([place])    Glioblastoma at 65 years old  . Depression   . Hyperlipidemia   . Hypertension   . Thyroid disease    hypothyroidism    Current Outpatient Medications on File Prior to Visit  Medication Sig Dispense Refill  . acetaminophen (TYLENOL) 500 MG tablet Take 500 mg by mouth every 6 (six) hours as needed.    Marland Kitchen albuterol (VENTOLIN HFA) 108 (90 Base) MCG/ACT inhaler Inhale 2 puffs into the lungs every 4 (four) hours as needed for wheezing (cough, shortness of breath or wheezing.). 18 g 3  . b complex vitamins tablet Take 1 tablet by mouth daily.    Marland Kitchen escitalopram (LEXAPRO) 10 MG tablet TAKE 1 TABLET BY MOUTH DAILY. 90 tablet 1  . lisinopril-hydrochlorothiazide (ZESTORETIC) 10-12.5 MG tablet Take 0.5 tablets by mouth daily. 45 tablet 1  . oxybutynin (DITROPAN-XL) 10 MG 24 hr tablet Take 10 mg by mouth daily.    . simvastatin (ZOCOR) 20 MG tablet TAKE 1 TABLET BY MOUTH AT BEDTIME 90 tablet 0  . SYNTHROID 100 MCG tablet TAKE 1 TABLET BY MOUTH DAILY BEFORE BREAKFAST. 90 tablet 1   No current facility-administered medications on file prior to visit.    No Known Allergies  Family History  Problem Relation Age of Onset  . COPD Mother   . Hypertension Father   . Dementia Father   . COPD Brother   . Colon cancer Neg Hx   . Esophageal cancer Neg Hx   . Rectal cancer Neg Hx   . Stomach cancer Neg Hx      Social History   Socioeconomic History  . Marital status: Married    Spouse name: Not on file  . Number of children: 3  . Years of education: Not on file  . Highest education level: Not on file  Occupational History  . Occupation: front desk    Comment: [employer]  Tobacco Use  . Smoking status: Former Smoker    Packs/day: 1.00    Years: 10.00    Pack years: 10.00    Types: Cigarettes    Quit date: 11/22/1981    Years since quitting: 38.6  . Smokeless tobacco: Never Used  Vaping Use  . Vaping Use: Never used  Substance and Sexual Activity  . Alcohol use: No  . Drug use: No  . Sexual activity: Yes  Other Topics Concern  . Not on file  Social History Narrative  . Not on file   Social Determinants of Health   Financial Resource Strain: Not on file  Food Insecurity: Not on file  Transportation Needs: Not on file  Physical Activity: Not on file  Stress: Not on file  Social Connections: Not on file   Review of Systems - See HPI.  All other ROS are negative.  BP 108/72   Pulse 89   Temp 98 F (36.7 C) (Skin)   Resp  16   Wt 171 lb 6.4 oz (77.7 kg)   SpO2 99%   BMI 30.36 kg/m   Physical Exam Vitals reviewed.  Constitutional:      Appearance: Normal appearance.  HENT:     Head: Normocephalic and atraumatic.     Right Ear: Tympanic membrane normal.     Left Ear: Tympanic membrane normal.     Mouth/Throat:     Mouth: Mucous membranes are moist.  Eyes:     Conjunctiva/sclera: Conjunctivae normal.     Pupils: Pupils are equal, round, and reactive to light.  Cardiovascular:     Rate and Rhythm: Normal rate and regular rhythm.     Pulses: Normal pulses.     Heart sounds: Normal heart sounds.  Pulmonary:     Effort: Pulmonary effort is normal.     Breath sounds: Normal breath sounds.  Musculoskeletal:     Cervical back: Neck supple.  Neurological:     General: No focal deficit present.     Mental Status: She is alert and  oriented to person, place, and time.  Psychiatric:        Mood and Affect: Mood normal.    Assessment/Plan: 1. Hyperlipidemia, unspecified hyperlipidemia type 20 pound weight loss.  Continue good work with diet and exercise.  We will repeat fasting lipids today so we can determine weaning dose or if medication can be stopped. - Comprehensive metabolic panel - Lipid panel  2. Essential hypertension Will discontinue lisinopril hydrochlorothiazide.  We will have her remain on plain lisinopril 10 mg once daily.  She is to check blood pressure measurements at home and record.  Follow-up in a week and a half to see how blood pressures are.  May be able to completely wean off of medication at that time.  Repeat labs today given her mention of lightheadedness which is most likely from too much blood pressure medication, but wanted to be cautious - Comprehensive metabolic panel - CBC with Differential/Platelet  3. Major depression in remission [facility]) This remains stable.  She has a lot of stressors ongoing.  We will continue this medication for now.  4. Hypothyroidism, unspecified type Taking thyroid medication as directed.  We will repeat TSH levels today. - TSH  This visit occurred during the SARS-CoV-2 public health emergency.  Safety protocols were in place, including screening questions prior to the visit, additional usage of staff PPE, and extensive cleaning of exam room while observing appropriate contact time as indicated for disinfecting solutions.     Leeanne Rio, PA-C

## 2020-07-10 NOTE — Patient Instructions (Addendum)
Please go to the lab today for blood work.  I will call you with your results. We will alter treatment regimen(s) if indicated by your results.   I have decreased your BP medication to plain lisinopril to take daily.  Keep a home check on BP and record. Call me in a week so we can make further adjustments if needed.   Our [facility] office has COVID testing tonight at 5:30. Speak to the front desk if you decide to get tested.   Otherwise you can go to [email or web address] for other local testing sites

## 2020-07-14 ENCOUNTER — Other Ambulatory Visit: Payer: Self-pay | Admitting: Emergency Medicine

## 2020-07-14 DIAGNOSIS — E785 Hyperlipidemia, unspecified: Secondary | ICD-10-CM

## 2020-07-14 DIAGNOSIS — R718 Other abnormality of red blood cells: Secondary | ICD-10-CM

## 2020-07-14 MED ORDER — SIMVASTATIN 20 MG PO TABS: 10.0000 mg | ORAL_TABLET | Freq: Every day | ORAL | 0 refills | Status: DC

## 2020-07-15 ENCOUNTER — Telehealth: Payer: Self-pay | Admitting: Hematology

## 2020-07-15 NOTE — Telephone Encounter (Signed)
I received a new hem referal from [facility] at [facility] for abnl rbc. Mrs. Heidi Christensen has been cld and scheduled to see Dr. Irene Limbo on 1/26 at 1pm. Pt aware to arrive 20 minutes early.

## 2020-07-15 NOTE — Progress Notes (Signed)
HEMATOLOGY/ONCOLOGY CONSULTATION NOTE  Date of Service: 07/15/2020  Patient Care Team: Delorse Limber as PCP - General (Family Medicine) Delorse Limber as Physician Assistant (Family Medicine)  CHIEF COMPLAINTS/PURPOSE OF CONSULTATION:  Abnormal Red Blood Cell Count  HISTORY OF PRESENTING ILLNESS:   Heidi Christensen is a wonderful 65 y.o. female who has been referred to [place] by Dr.William Hassell Done, PA-C for evaluation and management of abnormal red blood cell count. The pt reports that she is doing well overall. We are joined today by her husband Shanon Brow.  The pt reports that she was referred by her PCP for consecutive elevated counts of RBC over the last three tests. The pt reports that she had one incident of getting dizzy and experiencing a syncope over Christmas time while wrapping gifts. This occurred after she was moving around a lot. She completely lost consciousness according to her husband. This was a one-time event.The pt notes it was possible she was dehydrated or could have not eaten enough.   The pt notes that she currently experiences bladder issues related to frequency and urgency. She has been seeing her Urologist for this urge incontinence and is currently on Ditropan-XL. She has a history of wearing pads, but is no longer on pads following her retirement recently. She has a follow up in March 2022 to discuss cessation of this medication.  The pt notes she has a low blood pressure and is currently on medication for this. Her PCP is contemplating cessation of this medication.  The pt notes that she has lost 40 pounds with [employer] since August 2021. She has an appt with PCP regarding medication changes and updates related to this lifestyle change. She notes that her diet changed dramatically and properly explains the weight loss. The pt has not visited a fast food restaurant nor a milkshake.  The pt notes that she experienced some trouble sleeping  recently as of last night. She notes the combination of thoughts related to recent weight loss, recent retirement, upcoming cancer center visit. This was a one-time experience last night prior to the visit today.  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 They did surgery when she was 65 years old and told her they got 1/3 of this. The pt got radiation and chemo at [facility] as well. The pt notes that she was instructed as she grew up that the doctors did not expect her to to live past [age] years old. She had annual scans for this until she was 65 years old. Following, she received a full body scan that showed no signs of tumor anywhere in her body.   Lab results 07/10/2020 of CBC w/diff and CMP is as follows: all values are WNL except for Glucose of 128, RBC of 5.18, RDW of 15.8. 07/10/2020 TSH of 0.44. 07/10/2020 Lipid Panel all WNL.  On review of systems, pt denies chest pain, SOB, heart palpitations, loss of bowel and urination control, sleep apnea, fevers, chills, night sweats, changes in bowel habits, acute fatigue, acute skin rashes, abdominal pain, leg swelling and any other symptoms.   MEDICAL HISTORY:  Past Medical History:  Diagnosis Date  . Allergy   . Arthritis   . Asthma   . Cancer ([place])    Glioblastoma at 65 years old  . Depression   . Hyperlipidemia   . Hypertension   . Thyroid disease    hypothyroidism  SURGICAL HISTORY: Past Surgical History:  Procedure Laterality Date  . BRAIN SURGERY  65 yo  . CHOLECYSTECTOMY  1998  . EXPLORATORY LAPAROTOMY  1983    SOCIAL HISTORY: Social History   Socioeconomic History  . Marital status: Married    Spouse name: Not on file  . Number of children: 3  . Years of education: Not on file  . Highest education level: Not on file  Occupational History  . Occupation: front desk    Comment: [employer]  Tobacco Use  . Smoking status: Former Smoker     Packs/day: 1.00    Years: 10.00    Pack years: 10.00    Types: Cigarettes    Quit date: 11/22/1981    Years since quitting: 38.6  . Smokeless tobacco: Never Used  Vaping Use  . Vaping Use: Never used  Substance and Sexual Activity  . Alcohol use: No  . Drug use: No  . Sexual activity: Yes  Other Topics Concern  . Not on file  Social History Narrative  . Not on file   Social Determinants of Health   Financial Resource Strain: Not on file  Food Insecurity: Not on file  Transportation Needs: Not on file  Physical Activity: Not on file  Stress: Not on file  Social Connections: Not on file  Intimate Partner Violence: Not on file    FAMILY HISTORY: Family History  Problem Relation Age of Onset  . COPD Mother   . Hypertension Father   . Dementia Father   . COPD Brother   . Colon cancer Neg Hx   . Esophageal cancer Neg Hx   . Rectal cancer Neg Hx   . Stomach cancer Neg Hx     ALLERGIES:  has No Known Allergies.  MEDICATIONS:  Current Outpatient Medications  Medication Sig Dispense Refill  . acetaminophen (TYLENOL) 500 MG tablet Take 500 mg by mouth every 6 (six) hours as needed.    Marland Kitchen albuterol (VENTOLIN HFA) 108 (90 Base) MCG/ACT inhaler Inhale 2 puffs into the lungs every 4 (four) hours as needed for wheezing (cough, shortness of breath or wheezing.). 18 g 3  . b complex vitamins tablet Take 1 tablet by mouth daily.    Marland Kitchen escitalopram (LEXAPRO) 10 MG tablet TAKE 1 TABLET BY MOUTH DAILY. 90 tablet 1  . lisinopril (ZESTRIL) 10 MG tablet Take 1 tablet (10 mg total) by mouth daily. 30 tablet 1  . oxybutynin (DITROPAN-XL) 10 MG 24 hr tablet Take 10 mg by mouth daily.    . simvastatin (ZOCOR) 20 MG tablet Take 0.5 tablets (10 mg total) by mouth at bedtime. 90 tablet 0  . SYNTHROID 100 MCG tablet TAKE 1 TABLET BY MOUTH DAILY BEFORE BREAKFAST. 90 tablet 1   No current facility-administered medications for this visit.    REVIEW OF SYSTEMS:    10 Point review of Systems was  done is negative except as noted above.  PHYSICAL EXAMINATION: ECOG PERFORMANCE STATUS: 0 - Asymptomatic  . Vitals:   07/16/20 1249  BP: 120/66  Pulse: 66  Resp: 18  Temp: 98.1 F (36.7 C)  SpO2: 97%   Filed Weights   07/16/20 1249  Weight: 173 lb (78.5 kg)   .Body mass index is 30.65 kg/m.    GENERAL:alert, in no acute distress and comfortable SKIN: no acute rashes, no significant lesions EYES: conjunctiva are pink and non-injected, sclera anicteric OROPHARYNX: MMM, no exudates, no oropharyngeal erythema or ulceration NECK: supple, no JVD  LYMPH:  no palpable lymphadenopathy in the cervical, axillary or inguinal regions LUNGS: clear to auscultation b/l with normal respiratory effort HEART: regular rate & rhythm ABDOMEN:  normoactive bowel sounds , non tender, not distended. Extremity: no pedal edema PSYCH: alert & oriented x 3 with fluent speech NEURO: no focal motor/sensory deficits  LABORATORY DATA:  I have reviewed the data as listed  CBC Latest Ref Rng & Units 07/16/2020 07/10/2020 02/11/2020  WBC 4.0 - 10.5 K/uL 7.2 7.3 7.5  Hemoglobin 12.0 - 15.0 g/dL 13.1 13.7 13.5  Hematocrit 36.0 - 46.0 % 41.5 41.6 41.5  Platelets 150 - 400 K/uL 279 271.0 263.0   . CBC    Component Value Date/Time   WBC 7.2 07/16/2020 1415   RBC 5.00 07/16/2020 1415   HGB 13.1 07/16/2020 1415   HCT 41.5 07/16/2020 1415   PLT 279 07/16/2020 1415   MCV 83.0 07/16/2020 1415   MCV 84.8 09/10/2012 1052   MCH 26.2 07/16/2020 1415   MCHC 31.6 07/16/2020 1415   RDW 15.5 07/16/2020 1415   LYMPHSABS 3.2 07/16/2020 1415   MONOABS 0.3 07/16/2020 1415   EOSABS 0.1 07/16/2020 1415   BASOSABS 0.1 07/16/2020 1415     . CMP Latest Ref Rng & Units 07/10/2020 02/11/2020 08/22/2019  Glucose 70 - 99 mg/dL 128(H) 97 99  BUN 6 - 23 mg/dL 22 22 22   Creatinine 0.40 - 1.20 mg/dL 0.87 0.86 0.83  Sodium 135 - 145 mEq/L 138 142 139  Potassium 3.5 - 5.1 mEq/L 3.6 3.9 4.8  Chloride 96 - 112 mEq/L 106 105  105  CO2 19 - 32 mEq/L 25 26 25   Calcium 8.4 - 10.5 mg/dL 9.7 9.7 9.5  Total Protein 6.0 - 8.3 g/dL 7.0 6.6 6.7  Total Bilirubin 0.2 - 1.2 mg/dL 0.5 0.5 0.5  Alkaline Phos 39 - 117 U/L 70 61 73  AST 0 - 37 U/L 16 16 15   ALT 0 - 35 U/L 16 21 19      RADIOGRAPHIC STUDIES: I have personally reviewed the radiological images as listed and agreed with the findings in the report. No results found.  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 Plan: -Discussed pt's syncope incident near Christmas. -Discussed pt's early childhood Gliobastoma tumor -Advised pt that her RBC is slightly elevated above normal, but rest of blood counts and Hematocrit and hemoglobin are all normal. Advised pt that RBC (in millions) is generally 1/3 of the Hemoglobin numbers.The pt's Hgb would be expected to be 15 given her RBC. 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 This is observed in benign conditions with no clinical consequences or erythrocytosis due to thyroid treatment.  -Advised pt her recent weight loss can affect dosing of thyroid replacement due to dosing per kg body weight of her prior weight loss. -Discussed possibility of additional blood tests for variant hemoglobin. The pt is agreeable to this. -Advised pt that abnormal antibodies or certain medications can cause increased RBC breakage. The pt notes no new vitamin supplementation. Thus, this is not likely the case. -Advised pt that if relative polycythemia is due to bone marrow issue, the time span of four years in stable situation is comforting. This is thus highly unlikely to be the case. -Advise pt this variation in RBC and Hgb may be chronic from birth. -Advised pt to  f/u w PCP regarding BP medication change. The pt is agreeable to this. -Will see  back in 2 weeks via phone.  Follow Up Labs today Phone visit with Dr Irene Limbo in 2 weeks  . Orders Placed This Encounter  Procedures  . CBC with Differential/Platelet    Standing Status:   Future    Number of Occurrences:   1    Standing Expiration Date:   07/16/2021  . JAK2 (including V617F and Exon 12), MPL, and CALR-Next Generation Sequencing    Standing Status:   Future    Number of Occurrences:   1    Standing Expiration Date:   07/16/2021  . Ferritin    Standing Status:   Future    Number of Occurrences:   1    Standing Expiration Date:   07/16/2021  . Iron and TIBC    Standing Status:   Future    Number of Occurrences:   1    Standing Expiration Date:   07/16/2021  . Hgb Fractionation Cascade    Standing Status:   Future    Number of Occurrences:   1    Standing Expiration Date:   07/16/2021  . Alpha-Thalassemia GenotypR    Standing Status:   Future    Number of Occurrences:   1    Standing Expiration Date:   07/16/2021    All of the patients questions were answered with apparent satisfaction. The patient knows to call the clinic with any problems, questions or concerns.  I spent 45 minutes counseling the patient face to face. The total time spent in the appointment was 60 minutes and more than 50% was on counseling and direct patient cares.  Sullivan Lone MD [place] AAHIVMS [facility] Hematology/Oncology Physician [facility]  (Office):       [phone] (Work cell):  [phone] (Fax):           [phone]  07/15/2020 2:47 PM   I, Reinaldo Raddle, am acting as scribe for Dr. Sullivan Lone, MD.   .I have reviewed the above documentation for accuracy and completeness, and I agree with the above. Brunetta Genera MD

## 2020-07-16 ENCOUNTER — Inpatient Hospital Stay: Payer: 59 | Attending: Hematology | Admitting: Hematology

## 2020-07-16 ENCOUNTER — Other Ambulatory Visit: Payer: Self-pay

## 2020-07-16 ENCOUNTER — Inpatient Hospital Stay: Payer: 59

## 2020-07-16 VITALS — BP 120/66 | HR 66 | Temp 98.1°F | Resp 18 | Ht 63.0 in | Wt 173.0 lb

## 2020-07-16 DIAGNOSIS — Z923 Personal history of irradiation: Secondary | ICD-10-CM | POA: Diagnosis not present

## 2020-07-16 DIAGNOSIS — Z85841 Personal history of malignant neoplasm of brain: Secondary | ICD-10-CM | POA: Diagnosis not present

## 2020-07-16 DIAGNOSIS — Z87891 Personal history of nicotine dependence: Secondary | ICD-10-CM | POA: Insufficient documentation

## 2020-07-16 DIAGNOSIS — Z9221 Personal history of antineoplastic chemotherapy: Secondary | ICD-10-CM | POA: Diagnosis not present

## 2020-07-16 DIAGNOSIS — D751 Secondary polycythemia: Secondary | ICD-10-CM

## 2020-07-16 DIAGNOSIS — R634 Abnormal weight loss: Secondary | ICD-10-CM | POA: Insufficient documentation

## 2020-07-16 DIAGNOSIS — R718 Other abnormality of red blood cells: Secondary | ICD-10-CM | POA: Insufficient documentation

## 2020-07-16 LAB — CBC WITH DIFFERENTIAL/PLATELET
Abs Immature Granulocytes: 0.01 10*3/uL (ref 0.00–0.07)
Basophils Absolute: 0.1 10*3/uL (ref 0.0–0.1)
Basophils Relative: 1 %
Eosinophils Absolute: 0.1 10*3/uL (ref 0.0–0.5)
Eosinophils Relative: 1 %
HCT: 41.5 % (ref 36.0–46.0)
Hemoglobin: 13.1 g/dL (ref 12.0–15.0)
Immature Granulocytes: 0 %
Lymphocytes Relative: 44 %
Lymphs Abs: 3.2 10*3/uL (ref 0.7–4.0)
MCH: 26.2 pg (ref 26.0–34.0)
MCHC: 31.6 g/dL (ref 30.0–36.0)
MCV: 83 fL (ref 80.0–100.0)
Monocytes Absolute: 0.3 10*3/uL (ref 0.1–1.0)
Monocytes Relative: 4 %
Neutro Abs: 3.5 10*3/uL (ref 1.7–7.7)
Neutrophils Relative %: 50 %
Platelets: 279 10*3/uL (ref 150–400)
RBC: 5 MIL/uL (ref 3.87–5.11)
RDW: 15.5 % (ref 11.5–15.5)
WBC: 7.2 10*3/uL (ref 4.0–10.5)
nRBC: 0 % (ref 0.0–0.2)

## 2020-07-16 LAB — IRON AND TIBC
Iron: 80 ug/dL (ref 41–142)
Saturation Ratios: 24 % (ref 21–57)
TIBC: 336 ug/dL (ref 236–444)
UIBC: 256 ug/dL (ref 120–384)

## 2020-07-16 LAB — FERRITIN: Ferritin: 179 ng/mL (ref 11–307)

## 2020-07-16 NOTE — Patient Instructions (Signed)
Thank you for choosing [facility] to provide your oncology and hematology care.   Should you have questions after your visit to the [facility] ([facility]), please contact this office at [phone] between 8:30 AM and 4:30 PM.  Voice mails left after 4:00 PM may not be returned until the following business day.  Calls received after 4:30 PM will be answered by an off-site Nurse Triage Line.    Prescription Refills:  Please have your pharmacy contact us directly for most prescription requests.  Contact the office directly for refills of narcotics (pain medications). Allow 48-72 hours for refills.  Appointments: Please contact the [facility] scheduling department [phone] for questions regarding [facility] appointment scheduling.  Contact the schedulers with any scheduling changes so that your appointment can be rescheduled in a timely manner.   Central Scheduling for [place] [phone] - Call to schedule procedures such as PET scans, CT scans, MRI, Ultrasound, etc.  To afford each patient quality time with our providers, please arrive 30 minutes before your scheduled appointment time.  If you arrive late for your appointment, you may be asked to reschedule.  We strive to give you quality time with our providers, and arriving late affects you and other patients whose appointments are after yours. If you are a no show for multiple scheduled visits, you may be dismissed from the clinic at the providers discretion.     Resources: [facility] Social Workers [phone] for additional information on assistance programs or assistance connecting with community support programs   [place] DSS  [phone]: Information regarding food stamps, Medicaid, and utility assistance [place] [employer] [place] [phone]   [place] [employer]'s shared-ride transportation service for eligible riders who have a disability that prevents them from riding the fixed route bus.   Medicare  Rights Center 800-333-4114 Helps people with Medicare understand their rights and benefits, navigate the Medicare system, and secure the quality healthcare they deserve [employer] 800-227-2345 Assists patients locate various types of support and financial assistance Cancer Care: 1-800-813-HOPE (4673) Provides financial assistance, online support groups, medication/co-pay assistance.   Transportation Assistance for appointments at [facility]: Transportation Coordinator [phone]  Again, thank you for choosing  Cancer Center for your care.       

## 2020-07-17 MED FILL — ESCITALOPRAM 10 MG TABLET: 10 | 90 days supply | Qty: 90 | Fill #1

## 2020-07-18 LAB — HGB FRACTIONATION CASCADE
Hgb A2: 2.5 % (ref 1.8–3.2)
Hgb A: 97.5 % (ref 96.4–98.8)
Hgb F: 0 % (ref 0.0–2.0)
Hgb S: 0 %

## 2020-07-21 ENCOUNTER — Encounter: Payer: Self-pay | Admitting: Physician Assistant

## 2020-07-24 LAB — ALPHA-THALASSEMIA GENOTYPR

## 2020-07-25 LAB — JAK2 (INCLUDING V617F AND EXON 12), MPL,& CALR-NEXT GEN SEQ

## 2020-07-28 ENCOUNTER — Other Ambulatory Visit: Payer: Self-pay | Admitting: Family Medicine

## 2020-07-28 ENCOUNTER — Other Ambulatory Visit: Payer: Self-pay | Admitting: Physician Assistant

## 2020-07-28 DIAGNOSIS — E039 Hypothyroidism, unspecified: Secondary | ICD-10-CM

## 2020-07-28 MED FILL — SYNTHROID 100 MCG TABLET: 100 | 90 days supply | Qty: 90 | Fill #0

## 2020-07-28 NOTE — Progress Notes (Signed)
HEMATOLOGY/ONCOLOGY CONSULTATION NOTE  Date of Service: 07/28/2020  Patient Care Team: Delorse Limber as PCP - General (Family Medicine) Delorse Limber as Physician Assistant (Family Medicine)  CHIEF COMPLAINTS/PURPOSE OF CONSULTATION:  Abnormal Red Blood Cell Count  HISTORY OF PRESENTING ILLNESS:   Heidi Christensen is a wonderful 65 y.o. female who has been referred to [place] by Dr.William Hassell Done, PA-C for evaluation and management of abnormal red blood cell count. The pt reports that she is doing well overall. We are joined today by her husband Heidi Christensen.  The pt reports that she was referred by her PCP for consecutive elevated counts of RBC over the last three tests. The pt reports that she had one incident of getting dizzy and experiencing a syncope over Christmas time while wrapping gifts. This occurred after she was moving around a lot. She completely lost consciousness according to her husband. This was a one-time event.The pt notes it was possible she was dehydrated or could have not eaten enough.   The pt notes that she currently experiences bladder issues related to frequency and urgency. She has been seeing her Urologist for this urge incontinence and is currently on Ditropan-XL. She has a history of wearing pads, but is no longer on pads following her retirement recently. She has a follow up in March 2022 to discuss cessation of this medication.  The pt notes she has a low blood pressure and is currently on medication for this. Her PCP is contemplating cessation of this medication.  The pt notes that she has lost 40 pounds with [employer] since August 2021. She has an appt with PCP regarding medication changes and updates related to this lifestyle change. She notes that her diet changed dramatically and properly explains the weight loss. The pt has not visited a fast food restaurant nor a milkshake.  The pt notes that she experienced some trouble sleeping  recently as of last night. She notes the combination of thoughts related to recent weight loss, recent retirement, upcoming cancer center visit. This was a one-time experience last night prior to the visit today.  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 They did surgery when she was 65 years old and told her they got 1/3 of this. The pt got radiation and chemo at [facility] as well. The pt notes that she was instructed as she grew up that the doctors did not expect her to to live past [age] years old. She had annual scans for this until she was 65 years old. Following, she received a full body scan that showed no signs of tumor anywhere in her body.   Lab results 07/10/2020 of CBC w/diff and CMP is as follows: all values are WNL except for Glucose of 128, RBC of 5.18, RDW of 15.8. 07/10/2020 TSH of 0.44. 07/10/2020 Lipid Panel all WNL.  On review of systems, pt denies chest pain, SOB, heart palpitations, loss of bowel and urination control, sleep apnea, fevers, chills, night sweats, changes in bowel habits, acute fatigue, acute skin rashes, abdominal pain, leg swelling and any other symptoms.  INTERVAL HISTORY I connected with Georgina Quint  on 07/30/2020 by telephone and verified that I am speaking with the correct person using two identifiers.   I discussed the limitations of evaluation and management by telemedicine. The patient expressed understanding and agreed to proceed.   Other persons participating in the visit and their role  in the encounter:                                                         - Ross Judd, Medical Scribe     Patient's location: Home Provider's location[facility]  Jerrell MylarDeborah Vaughn Christensen is a wonderful 65 y.o. female who is here today for evaluation and management of abnormal red blood cell count. The patient's last visit with us was on 07/16/2020. The pt reports that she is doing well overall.  The  pt reports no new concerns or symptoms.  Lab results 07/16/2020 of CBC w/diff is as follows: all values are WNL. 07/16/2020 Ferritin of 179. 07/16/2020 Iron of 80, TIBC of 336, Sat ratio of 24, UIBC of 256. 07/16/2020 JAK2 negative. 07/16/2020 Hgb F of 0.0, Hgb A of 97.5, Hgb A2 of 2.5, HgbS of 0.0. 07/16/2020 Alpha Thalassemia GenotypR negative.  On review of systems, pt denies any symptoms.  MEDICAL HISTORY:  Past Medical History:  Diagnosis Date  . Allergy   . Arthritis   . Asthma   . Cancer (HCC)    Glioblastoma at 65 years old  . Depression   . Hyperlipidemia   . Hypertension   . Thyroid disease    hypothyroidism    SURGICAL HISTORY: Past Surgical History:  Procedure Laterality Date  . BRAIN SURGERY  65 yo  . CHOLECYSTECTOMY  1998  . EXPLORATORY LAPAROTOMY  1983    SOCIAL HISTORY: Social History   Socioeconomic History  . Marital status: Married    Spouse name: Not on file  . Number of children: 3  . Years of education: Not on file  . Highest education level: Not on file  Occupational History  . 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 Smoking status: Former Smoker    Packs/day: 1.00    Years: 10.00    Pack years: 10.00    Types: Cigarettes    Quit date: 11/22/1981    Years since quitting: 38.7  . Smokeless tobacco: Never Used  Vaping Use  . Vaping Use: Never used  Substance and Sexual Activity  . Alcohol use: No  . Drug use: No  . Sexual activity: Yes  Other Topics Concern  . Not on file  Social History Narrative  . Not on file   Social Determinants of Health   Financial Resource Strain: Not on file  Food Insecurity: Not on file  Transportation Needs: Not on file  Physical Activity: Not on file  Stress: Not on file  Social Connections: Not on file  Intimate Partner Violence: Not on file    FAMILY HISTORY: Family History  Problem Relation Age of Onset  . COPD Mother   . Hypertension  Father   . Dementia Father   . COPD Brother   . Colon cancer Neg Hx   . Esophageal cancer Neg Hx   . Rectal cancer Neg Hx   . Stomach cancer Neg Hx     ALLERGIES:  has No Known Allergies.  MEDICATIONS:  Current Outpatient Medications  Medication Sig Dispense Refill  . acetaminophen (TYLENOL) 500 MG tablet Take 500 mg by mouth every 6 (six) hours as needed.    Marland Kitchen. albuterol (VENTOLIN HFA) 108 (90 Base) MCG/ACT inhaler Inhale  2 puffs into the lungs every 4 (four) hours as needed for wheezing (cough, shortness of breath or wheezing.). 18 g 3  . b complex vitamins tablet Take 1 tablet by mouth daily.    Marland Kitchen escitalopram (LEXAPRO) 10 MG tablet TAKE 1 TABLET BY MOUTH DAILY. 90 tablet 1  . lisinopril (ZESTRIL) 10 MG tablet Take 1 tablet (10 mg total) by mouth daily. 30 tablet 1  . oxybutynin (DITROPAN-XL) 10 MG 24 hr tablet Take 10 mg by mouth daily.    . simvastatin (ZOCOR) 20 MG tablet Take 0.5 tablets (10 mg total) by mouth at bedtime. 90 tablet 0  . SYNTHROID 100 MCG tablet TAKE 1 TABLET BY MOUTH DAILY BEFORE BREAKFAST. 90 tablet 1   No current facility-administered medications for this visit.   REVIEW OF SYSTEMS:   10 Point review of Systems was done is negative except as noted above.  PHYSICAL EXAMINATION: ECOG PERFORMANCE STATUS: 0 - Asymptomatic   There were no vitals filed for this visit. There were no vitals filed for this visit. .There is no height or weight on file to calculate BMI.   Telehealth Visit.  LABORATORY DATA:  I have reviewed the data as listed  CBC Latest Ref Rng & Units 07/16/2020 07/10/2020 02/11/2020  WBC 4.0 - 10.5 K/uL 7.2 7.3 7.5  Hemoglobin 12.0 - 15.0 g/dL 13.1 13.7 13.5  Hematocrit 36.0 - 46.0 % 41.5 41.6 41.5  Platelets 150 - 400 K/uL 279 271.0 263.0   . CBC    Component Value Date/Time   WBC 7.2 07/16/2020 1415   RBC 5.00 07/16/2020 1415   HGB 13.1 07/16/2020 1415   HCT 41.5 07/16/2020 1415   PLT 279 07/16/2020 1415   MCV 83.0 07/16/2020  1415   MCV 84.8 09/10/2012 1052   MCH 26.2 07/16/2020 1415   MCHC 31.6 07/16/2020 1415   RDW 15.5 07/16/2020 1415   LYMPHSABS 3.2 07/16/2020 1415   MONOABS 0.3 07/16/2020 1415   EOSABS 0.1 07/16/2020 1415   BASOSABS 0.1 07/16/2020 1415     . CMP Latest Ref Rng & Units 07/10/2020 02/11/2020 08/22/2019  Glucose 70 - 99 mg/dL 128(H) 97 99  BUN 6 - 23 mg/dL 22 22 22   Creatinine 0.40 - 1.20 mg/dL 0.87 0.86 0.83  Sodium 135 - 145 mEq/L 138 142 139  Potassium 3.5 - 5.1 mEq/L 3.6 3.9 4.8  Chloride 96 - 112 mEq/L 106 105 105  CO2 19 - 32 mEq/L 25 26 25   Calcium 8.4 - 10.5 mg/dL 9.7 9.7 9.5  Total Protein 6.0 - 8.3 g/dL 7.0 6.6 6.7  Total Bilirubin 0.2 - 1.2 mg/dL 0.5 0.5 0.5  Alkaline Phos 39 - 117 U/L 70 61 73  AST 0 - 37 U/L 16 16 15   ALT 0 - 35 U/L 16 21 19    07/16/2020 JAK2   RADIOGRAPHIC STUDIES: I have personally reviewed the radiological images as listed and agreed with the findings in the report. No results found.  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  PLAN: -Discussed pt's labs, 07/16/2020; all mutations came back negative., no evidence of overt hemoglobinopathy -Advised pt that her Hgb should be 15 with a RBC of 5 M. She may have some relative polycythemia but normal Hgb of 13.1.  -Advised pt she may have relative eyrthrocytosis from thyroid replacement therapy. -Advised pt we have ruled out Polycythemia Vera due to mutations negative,  normal Iron levels, and Hgb Electrophoresis being normal.  -Advised pt there is nothing actionable at this time. Will continue to monitor labs and forward all test results to PCP.  -Will see back prn.   FOLLOW UP: RTC with Dr Irene Limbo as needed    All of the patients questions were answered with apparent satisfaction. The patient knows to call the clinic with any problems, questions or concerns.  The  total time spent in the appointment was 15 minutes and more than 50% was on counseling and direct patient cares.  Sullivan Lone MD [place] AAHIVMS [facility] Hematology/Oncology Physician [facility]  (Office):       [phone] (Work cell):  [phone] (Fax):           [phone]  07/28/2020 11:37 AM   I, Reinaldo Raddle, am acting as scribe for Dr. Sullivan Lone, MD.  .Brunetta Genera MD

## 2020-07-30 ENCOUNTER — Inpatient Hospital Stay: Payer: 59 | Attending: Hematology | Admitting: Hematology

## 2020-07-30 DIAGNOSIS — D751 Secondary polycythemia: Secondary | ICD-10-CM

## 2020-09-03 ENCOUNTER — Other Ambulatory Visit (HOSPITAL_COMMUNITY): Payer: Self-pay | Admitting: Urology

## 2020-09-16 MED FILL — LISINOPRIL 10 MG TABS: 10 | 30 days supply | Qty: 30 | Fill #1

## 2020-10-01 ENCOUNTER — Other Ambulatory Visit: Payer: Self-pay

## 2020-10-01 ENCOUNTER — Ambulatory Visit (INDEPENDENT_AMBULATORY_CARE_PROVIDER_SITE_OTHER): Payer: Medicare Other | Admitting: Registered Nurse

## 2020-10-09 ENCOUNTER — Other Ambulatory Visit: Payer: Self-pay | Admitting: Physician Assistant

## 2020-10-09 ENCOUNTER — Other Ambulatory Visit (HOSPITAL_COMMUNITY): Payer: Self-pay

## 2020-10-09 MED FILL — Oxybutynin Chloride Tab ER 24HR 10 MG: ORAL | 30 days supply | Qty: 30 | Fill #0 | Status: AC

## 2020-10-10 ENCOUNTER — Other Ambulatory Visit: Payer: Self-pay

## 2020-10-10 ENCOUNTER — Other Ambulatory Visit (HOSPITAL_COMMUNITY): Payer: Self-pay

## 2020-10-13 ENCOUNTER — Other Ambulatory Visit (HOSPITAL_COMMUNITY): Payer: Self-pay

## 2020-10-14 ENCOUNTER — Other Ambulatory Visit (HOSPITAL_COMMUNITY): Payer: Self-pay

## 2020-10-15 ENCOUNTER — Other Ambulatory Visit (HOSPITAL_COMMUNITY): Payer: Self-pay

## 2020-10-15 MED ORDER — ESCITALOPRAM OXALATE 10 MG PO TABS
1.0000 | ORAL_TABLET | Freq: Every day | ORAL | 4 refills | Status: DC
  Filled 2020-10-15: qty 90, 90d supply, fill #0
  Filled 2021-01-13: qty 90, 90d supply, fill #1
  Filled 2021-04-20: qty 90, 90d supply, fill #2
  Filled 2021-07-20: qty 90, 90d supply, fill #3
  Filled 2021-10-12: qty 90, 90d supply, fill #4
  Filled 2022-01-13: qty 3, 3d supply, fill #5

## 2020-10-21 ENCOUNTER — Other Ambulatory Visit (HOSPITAL_COMMUNITY): Payer: Self-pay

## 2020-10-22 ENCOUNTER — Other Ambulatory Visit (HOSPITAL_COMMUNITY): Payer: Self-pay

## 2020-10-29 ENCOUNTER — Other Ambulatory Visit (HOSPITAL_COMMUNITY): Payer: Self-pay

## 2020-10-29 MED ORDER — SIMVASTATIN 20 MG PO TABS
ORAL_TABLET | ORAL | 4 refills | Status: AC
  Filled 2020-10-29: qty 90, 90d supply, fill #0
  Filled 2021-05-03: qty 90, 90d supply, fill #1

## 2020-11-03 ENCOUNTER — Other Ambulatory Visit (HOSPITAL_COMMUNITY): Payer: Self-pay

## 2020-11-03 MED ORDER — LEVOTHYROXINE SODIUM 100 MCG PO TABS
ORAL_TABLET | ORAL | 4 refills | Status: DC
  Filled 2020-11-03: qty 90, 90d supply, fill #0
  Filled 2021-01-27: qty 90, 90d supply, fill #1
  Filled 2021-05-03: qty 90, 90d supply, fill #2
  Filled 2021-07-20: qty 90, 90d supply, fill #3
  Filled 2021-11-02: qty 90, 90d supply, fill #4

## 2020-11-03 MED ORDER — LISINOPRIL 10 MG PO TABS
1.0000 | ORAL_TABLET | Freq: Every day | ORAL | 4 refills | Status: AC
  Filled 2020-11-03: qty 90, 90d supply, fill #0
  Filled 2021-04-20: qty 90, 90d supply, fill #1
  Filled 2021-11-02: qty 90, 90d supply, fill #2

## 2020-11-03 MED FILL — Oxybutynin Chloride Tab ER 24HR 10 MG: ORAL | 30 days supply | Qty: 30 | Fill #1 | Status: AC

## 2020-11-04 ENCOUNTER — Other Ambulatory Visit (HOSPITAL_BASED_OUTPATIENT_CLINIC_OR_DEPARTMENT_OTHER): Payer: Self-pay

## 2020-11-04 ENCOUNTER — Other Ambulatory Visit (HOSPITAL_COMMUNITY): Payer: Self-pay

## 2020-11-05 ENCOUNTER — Other Ambulatory Visit (HOSPITAL_COMMUNITY): Payer: Self-pay

## 2020-12-08 ENCOUNTER — Other Ambulatory Visit (HOSPITAL_COMMUNITY): Payer: Self-pay

## 2020-12-08 MED FILL — Oxybutynin Chloride Tab ER 24HR 10 MG: ORAL | 30 days supply | Qty: 30 | Fill #2 | Status: AC

## 2021-01-13 ENCOUNTER — Other Ambulatory Visit (HOSPITAL_COMMUNITY): Payer: Self-pay

## 2021-01-13 MED FILL — Oxybutynin Chloride Tab ER 24HR 10 MG: ORAL | 30 days supply | Qty: 30 | Fill #3 | Status: AC

## 2021-01-27 ENCOUNTER — Other Ambulatory Visit (HOSPITAL_COMMUNITY): Payer: Self-pay

## 2021-02-17 ENCOUNTER — Other Ambulatory Visit (HOSPITAL_COMMUNITY): Payer: Self-pay

## 2021-02-17 MED FILL — Oxybutynin Chloride Tab ER 24HR 10 MG: ORAL | 30 days supply | Qty: 30 | Fill #4 | Status: AC

## 2021-03-25 ENCOUNTER — Other Ambulatory Visit (HOSPITAL_COMMUNITY): Payer: Self-pay

## 2021-03-25 MED FILL — Oxybutynin Chloride Tab ER 24HR 10 MG: ORAL | 30 days supply | Qty: 30 | Fill #5 | Status: AC

## 2021-04-20 ENCOUNTER — Other Ambulatory Visit (HOSPITAL_COMMUNITY): Payer: Self-pay

## 2021-04-22 IMAGING — MG DIGITAL SCREENING BILAT W/ TOMO W/ CAD
8 series · 8 of 24 positions shown · non-contrast
Comparison: Previous exam(s).

CLINICAL DATA: Screening.

EXAM:
DIGITAL SCREENING BILATERAL MAMMOGRAM WITH TOMO AND CAD

[R MLO synth-2D]
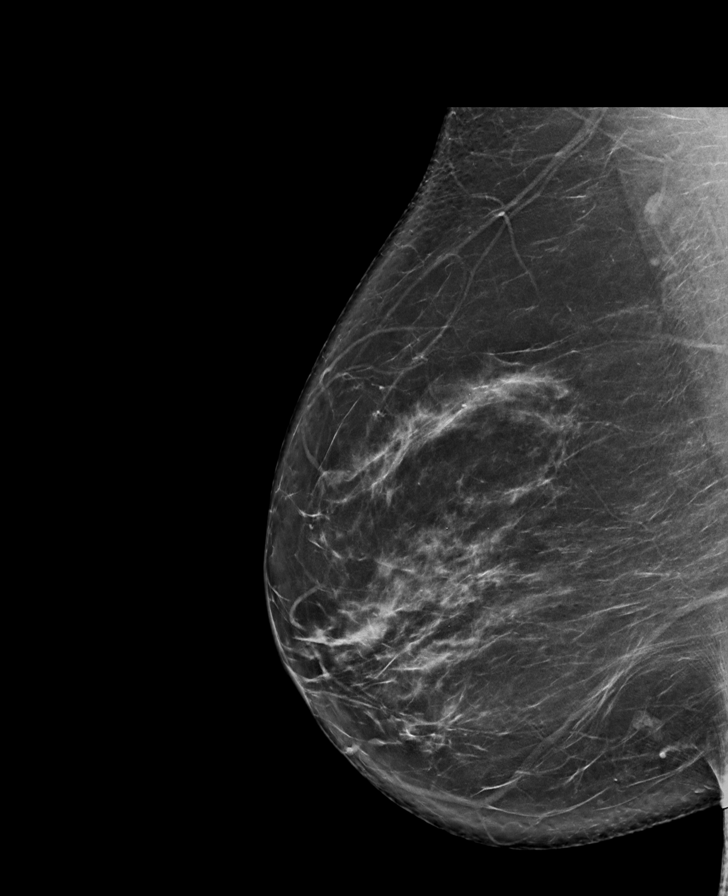

[R CC synth-2D]
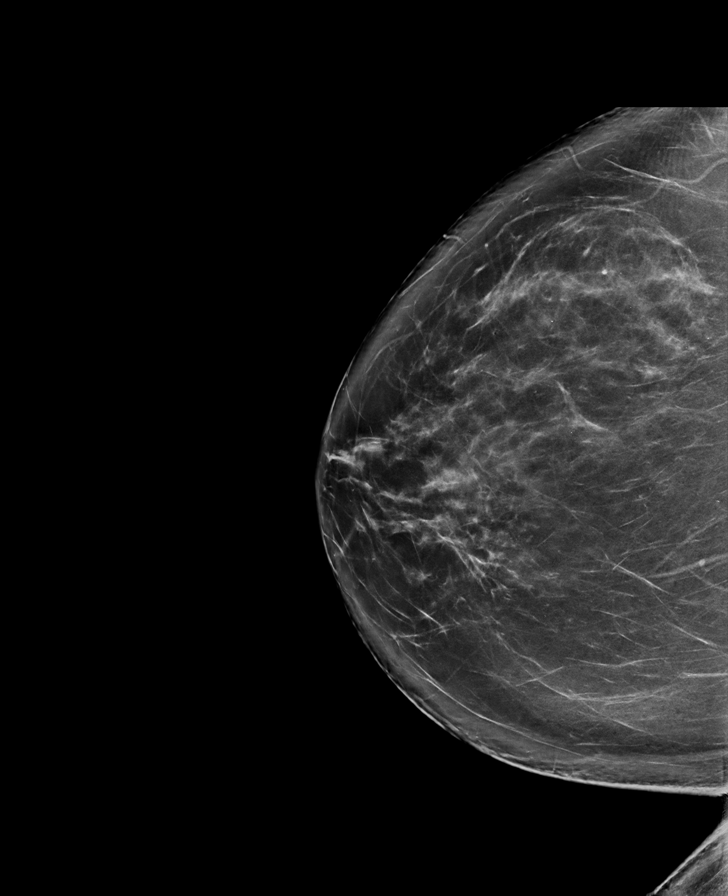

[L MLO synth-2D]
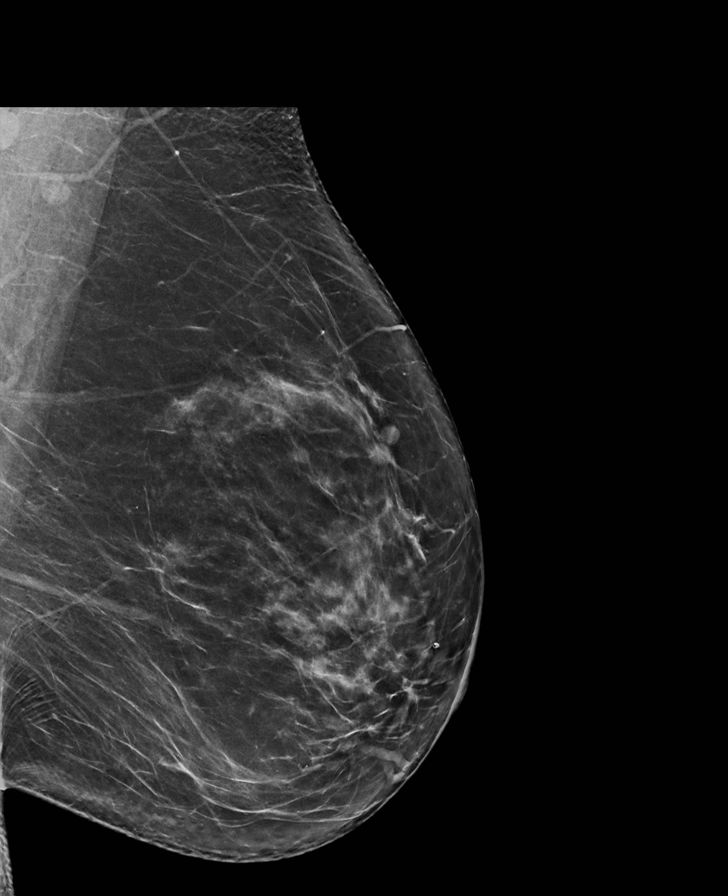

[L CC synth-2D]
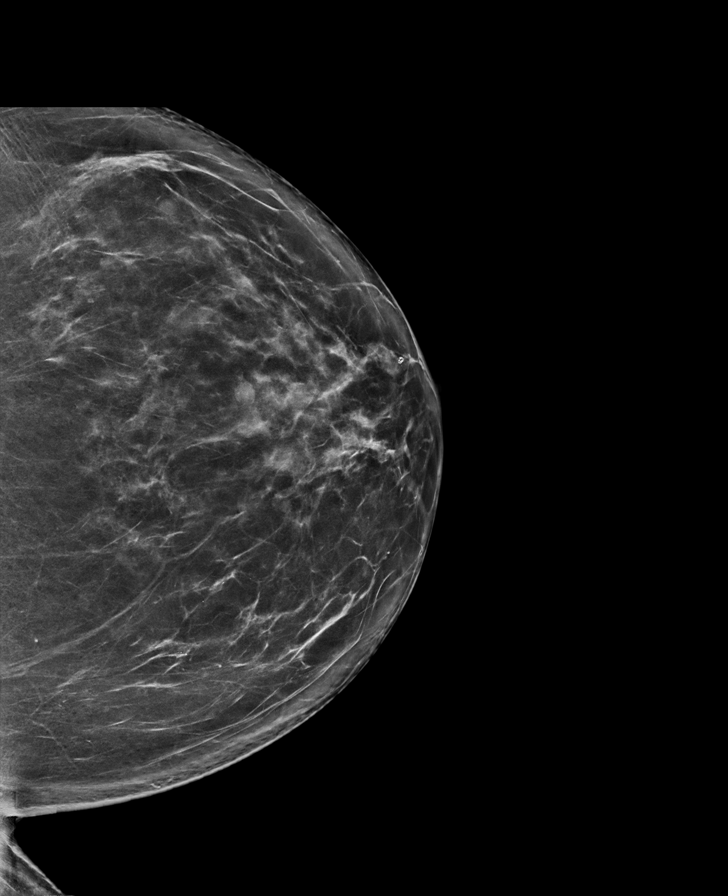

[R CC tomo · tomo slice 43/86.0]
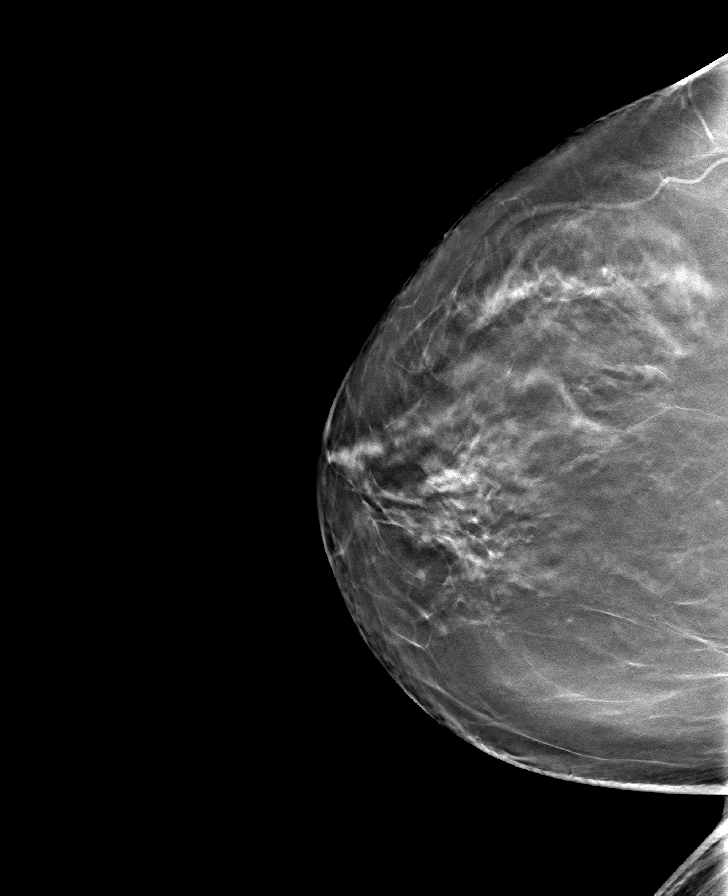

[R MLO tomo · tomo slice 45/90.0]
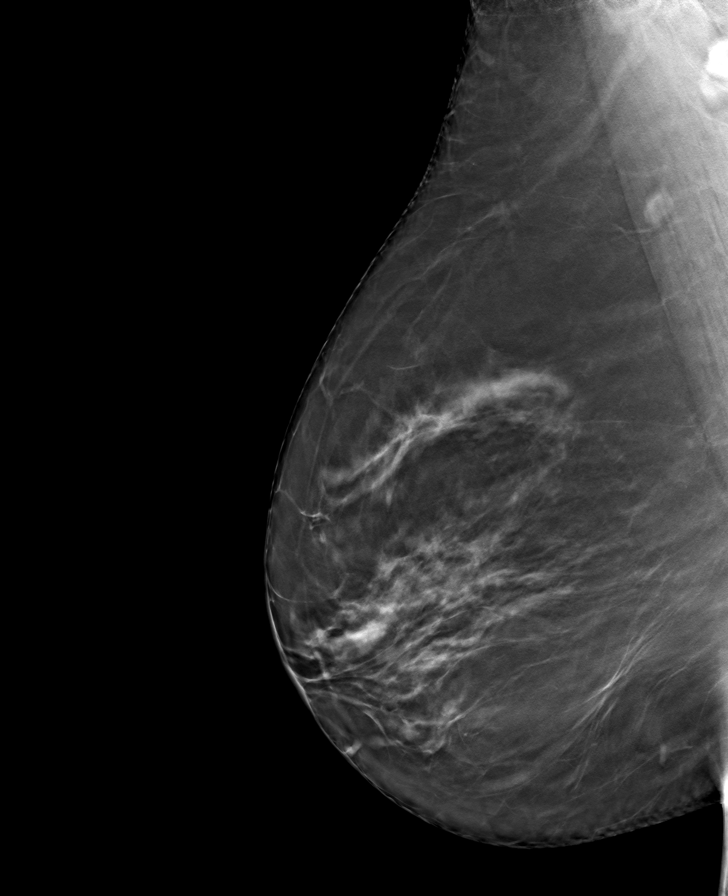

[L CC tomo · tomo slice 41/81.0]
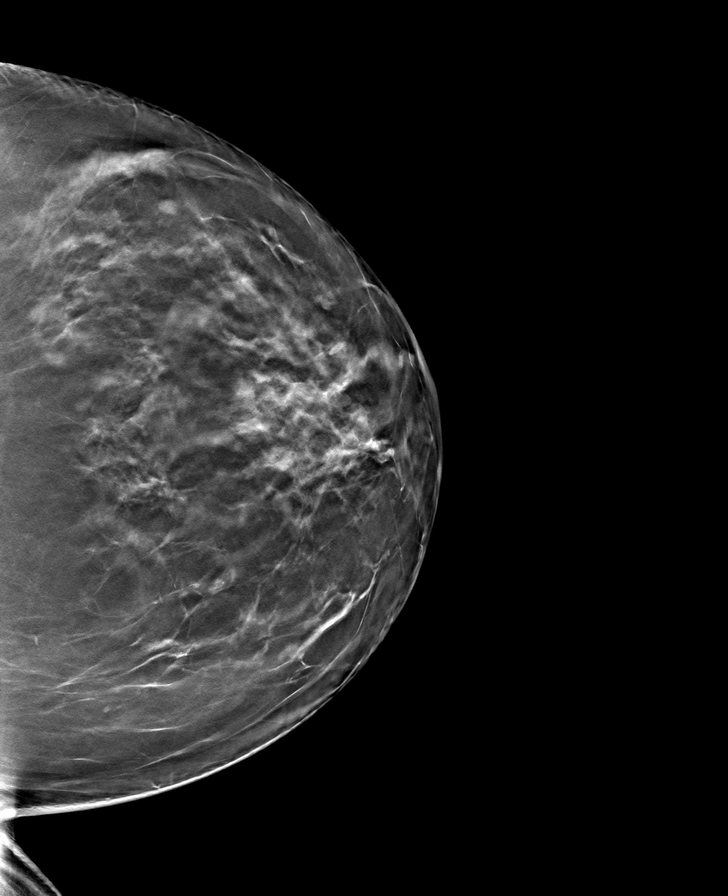

[L MLO tomo · tomo slice 45/89.0]
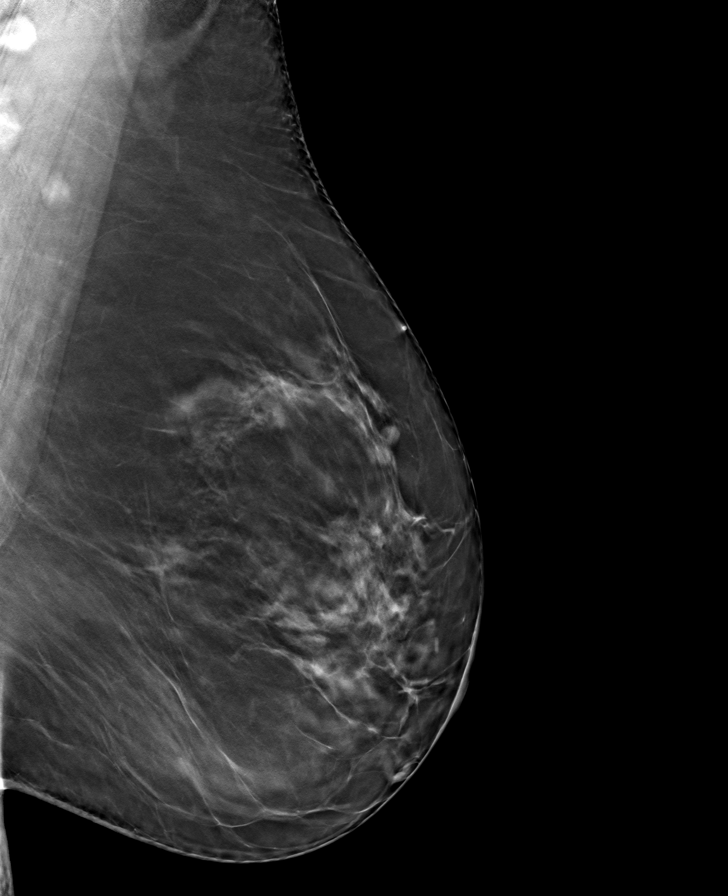

[8 of 24 positions shown; findings below may reference images not displayed]

ACR Breast Density Category b: There are scattered areas of
fibroglandular density.
FINDINGS: There are no findings suspicious for malignancy. Images were
processed with CAD.
IMPRESSION: No mammographic evidence of malignancy. A result letter of this
screening mammogram will be mailed directly to the patient.

RECOMMENDATION:
Screening mammogram in one year. (Code:[id number])

BI-RADS CATEGORY  1: Negative.

## 2021-04-23 ENCOUNTER — Other Ambulatory Visit (HOSPITAL_COMMUNITY): Payer: Self-pay

## 2021-04-23 MED FILL — Oxybutynin Chloride Tab ER 24HR 10 MG: ORAL | 30 days supply | Qty: 30 | Fill #6 | Status: AC

## 2021-05-03 ENCOUNTER — Other Ambulatory Visit (HOSPITAL_COMMUNITY): Payer: Self-pay

## 2021-05-04 ENCOUNTER — Other Ambulatory Visit (HOSPITAL_COMMUNITY): Payer: Self-pay

## 2021-05-25 ENCOUNTER — Other Ambulatory Visit (HOSPITAL_COMMUNITY): Payer: Self-pay

## 2021-05-25 MED FILL — Oxybutynin Chloride Tab ER 24HR 10 MG: ORAL | 30 days supply | Qty: 30 | Fill #7 | Status: AC

## 2021-06-23 ENCOUNTER — Other Ambulatory Visit (HOSPITAL_COMMUNITY): Payer: Self-pay

## 2021-06-23 MED FILL — Oxybutynin Chloride Tab ER 24HR 10 MG: ORAL | 30 days supply | Qty: 30 | Fill #8 | Status: AC

## 2021-07-20 ENCOUNTER — Other Ambulatory Visit (HOSPITAL_COMMUNITY): Payer: Self-pay

## 2021-07-20 MED FILL — Oxybutynin Chloride Tab ER 24HR 10 MG: ORAL | 30 days supply | Qty: 30 | Fill #9 | Status: AC

## 2021-08-11 ENCOUNTER — Other Ambulatory Visit (HOSPITAL_COMMUNITY): Payer: Self-pay

## 2021-08-11 MED ORDER — VITAMIN D (ERGOCALCIFEROL) 1.25 MG (50000 UNIT) PO CAPS
50000.0000 [IU] | ORAL_CAPSULE | ORAL | 0 refills | Status: AC
  Filled 2021-08-11: qty 13, 91d supply, fill #0

## 2021-08-24 ENCOUNTER — Other Ambulatory Visit (HOSPITAL_COMMUNITY): Payer: Self-pay

## 2021-08-24 MED FILL — Oxybutynin Chloride Tab ER 24HR 10 MG: ORAL | 30 days supply | Qty: 30 | Fill #10 | Status: AC

## 2021-09-10 ENCOUNTER — Other Ambulatory Visit (HOSPITAL_COMMUNITY): Payer: Self-pay

## 2021-09-22 ENCOUNTER — Other Ambulatory Visit: Payer: Self-pay | Admitting: Urology

## 2021-09-25 ENCOUNTER — Other Ambulatory Visit: Payer: Self-pay | Admitting: Urology

## 2021-09-25 ENCOUNTER — Other Ambulatory Visit (HOSPITAL_COMMUNITY): Payer: Self-pay

## 2021-09-29 ENCOUNTER — Other Ambulatory Visit (HOSPITAL_COMMUNITY): Payer: Self-pay

## 2021-09-29 MED ORDER — OXYBUTYNIN CHLORIDE ER 10 MG PO TB24
ORAL_TABLET | ORAL | 0 refills | Status: DC
  Filled 2021-09-29: qty 30, 30d supply, fill #0

## 2021-10-08 ENCOUNTER — Other Ambulatory Visit (HOSPITAL_COMMUNITY): Payer: Self-pay

## 2021-10-08 MED ORDER — OXYBUTYNIN CHLORIDE ER 10 MG PO TB24
ORAL_TABLET | ORAL | 3 refills | Status: AC
  Filled 2021-10-08: qty 90, 90d supply, fill #0
  Filled 2022-01-23: qty 90, 90d supply, fill #1
  Filled 2022-05-03: qty 90, 90d supply, fill #2
  Filled 2022-08-06: qty 90, 90d supply, fill #3

## 2021-10-12 ENCOUNTER — Other Ambulatory Visit (HOSPITAL_COMMUNITY): Payer: Self-pay

## 2021-10-12 MED ORDER — NITROFURANTOIN MACROCRYSTAL 100 MG PO CAPS
ORAL_CAPSULE | ORAL | 0 refills | Status: DC
  Filled 2021-10-12: qty 14, 7d supply, fill #0

## 2021-10-22 ENCOUNTER — Other Ambulatory Visit (HOSPITAL_COMMUNITY): Payer: Self-pay

## 2021-10-23 ENCOUNTER — Other Ambulatory Visit (HOSPITAL_COMMUNITY): Payer: Self-pay

## 2021-11-02 ENCOUNTER — Other Ambulatory Visit (HOSPITAL_COMMUNITY): Payer: Self-pay

## 2021-11-03 ENCOUNTER — Other Ambulatory Visit (HOSPITAL_COMMUNITY): Payer: Self-pay

## 2021-11-03 MED ORDER — SIMVASTATIN 20 MG PO TABS
ORAL_TABLET | ORAL | 4 refills | Status: AC
Start: 2021-11-02 — End: ?
  Filled 2021-11-03: qty 45, 90d supply, fill #0
  Filled 2022-01-23: qty 45, 90d supply, fill #1
  Filled 2022-05-06: qty 45, 90d supply, fill #2

## 2021-11-18 ENCOUNTER — Other Ambulatory Visit (HOSPITAL_COMMUNITY): Payer: Self-pay

## 2022-01-13 ENCOUNTER — Other Ambulatory Visit (HOSPITAL_COMMUNITY): Payer: Self-pay

## 2022-01-14 ENCOUNTER — Other Ambulatory Visit (HOSPITAL_COMMUNITY): Payer: Self-pay

## 2022-01-14 MED ORDER — ESCITALOPRAM OXALATE 10 MG PO TABS
10.0000 mg | ORAL_TABLET | Freq: Every day | ORAL | 4 refills | Status: AC
  Filled 2022-01-14: qty 90, 90d supply, fill #0
  Filled 2022-04-12: qty 90, 90d supply, fill #1

## 2022-01-15 ENCOUNTER — Other Ambulatory Visit (HOSPITAL_COMMUNITY): Payer: Self-pay

## 2022-01-23 ENCOUNTER — Other Ambulatory Visit (HOSPITAL_COMMUNITY): Payer: Self-pay

## 2022-01-25 ENCOUNTER — Other Ambulatory Visit (HOSPITAL_COMMUNITY): Payer: Self-pay

## 2022-01-25 MED ORDER — LEVOTHYROXINE SODIUM 100 MCG PO TABS
ORAL_TABLET | ORAL | 4 refills | Status: AC
  Filled 2022-01-25: qty 90, 90d supply, fill #0
  Filled 2022-05-06: qty 90, 90d supply, fill #1

## 2022-02-05 ENCOUNTER — Encounter: Payer: Self-pay | Admitting: Gastroenterology

## 2022-04-12 ENCOUNTER — Other Ambulatory Visit (HOSPITAL_COMMUNITY): Payer: Self-pay

## 2022-05-03 ENCOUNTER — Other Ambulatory Visit (HOSPITAL_COMMUNITY): Payer: Self-pay

## 2022-05-06 ENCOUNTER — Other Ambulatory Visit (HOSPITAL_COMMUNITY): Payer: Self-pay

## 2022-05-07 ENCOUNTER — Other Ambulatory Visit (HOSPITAL_COMMUNITY): Payer: Self-pay

## 2022-05-07 MED ORDER — LAGEVRIO 200 MG PO CAPS
4.0000 | ORAL_CAPSULE | Freq: Two times a day (BID) | ORAL | 0 refills | Status: DC
  Filled 2022-05-07: qty 40, 5d supply, fill #0

## 2022-05-07 MED ORDER — PAXLOVID (300/100) 20 X 150 MG & 10 X 100MG PO TBPK: 3.0000 | ORAL_TABLET | Freq: Two times a day (BID) | ORAL | 0 refills | Status: AC

## 2022-05-11 ENCOUNTER — Other Ambulatory Visit (HOSPITAL_COMMUNITY): Payer: Self-pay

## 2022-05-11 MED ORDER — LISINOPRIL 10 MG PO TABS
5.0000 mg | ORAL_TABLET | Freq: Every day | ORAL | 4 refills | Status: AC
  Filled 2022-05-11 – 2022-06-03 (×2): qty 50, 100d supply, fill #0
  Filled 2022-09-07: qty 50, 100d supply, fill #1

## 2022-05-28 ENCOUNTER — Other Ambulatory Visit (HOSPITAL_COMMUNITY): Payer: Self-pay

## 2022-06-03 ENCOUNTER — Other Ambulatory Visit (HOSPITAL_COMMUNITY): Payer: Self-pay

## 2022-07-22 ENCOUNTER — Other Ambulatory Visit (HOSPITAL_COMMUNITY): Payer: Self-pay

## 2022-07-22 MED ORDER — ESCITALOPRAM OXALATE 10 MG PO TABS
10.0000 mg | ORAL_TABLET | Freq: Every day | ORAL | 3 refills | Status: AC
Start: 2022-07-22 — End: ?
  Filled 2022-07-22: qty 90, 90d supply, fill #0
  Filled 2022-10-17: qty 90, 90d supply, fill #1
  Filled 2023-01-20: qty 90, 90d supply, fill #2

## 2022-08-06 ENCOUNTER — Other Ambulatory Visit (HOSPITAL_COMMUNITY): Payer: Self-pay

## 2022-08-06 MED ORDER — LEVOTHYROXINE SODIUM 100 MCG PO TABS
100.0000 ug | ORAL_TABLET | Freq: Every day | ORAL | 3 refills | Status: DC
  Filled 2022-08-06: qty 90, 90d supply, fill #0
  Filled 2022-11-03: qty 90, 90d supply, fill #1
  Filled 2023-02-07: qty 90, 90d supply, fill #2
  Filled 2023-06-01: qty 90, 90d supply, fill #3

## 2022-08-06 MED ORDER — OXYBUTYNIN CHLORIDE ER 10 MG PO TB24
10.0000 mg | ORAL_TABLET | Freq: Every day | ORAL | 3 refills | Status: DC
  Filled 2022-08-06 – 2022-11-08 (×2): qty 90, 90d supply, fill #0
  Filled 2023-02-07: qty 90, 90d supply, fill #1
  Filled 2023-05-07 – 2023-05-09 (×2): qty 90, 90d supply, fill #2

## 2022-08-06 MED ORDER — SIMVASTATIN 20 MG PO TABS
20.0000 mg | ORAL_TABLET | Freq: Every day | ORAL | 3 refills | Status: DC
Start: 2022-08-06 — End: 2023-08-09
  Filled 2022-08-06: qty 90, 90d supply, fill #0
  Filled 2022-11-03: qty 90, 90d supply, fill #1
  Filled 2023-02-07: qty 90, 90d supply, fill #2
  Filled 2023-05-10: qty 90, 90d supply, fill #3

## 2022-08-06 MED ORDER — LISINOPRIL 10 MG PO TABS
5.0000 mg | ORAL_TABLET | Freq: Every day | ORAL | 3 refills | Status: DC
Start: 2022-08-06 — End: 2023-08-09
  Filled 2022-08-06 – 2022-12-13 (×2): qty 45, 90d supply, fill #0
  Filled 2023-03-23: qty 45, 90d supply, fill #1
  Filled 2023-06-18 (×2): qty 45, 90d supply, fill #2

## 2022-08-07 ENCOUNTER — Other Ambulatory Visit (HOSPITAL_COMMUNITY): Payer: Self-pay

## 2022-09-28 ENCOUNTER — Encounter: Payer: Self-pay | Admitting: Gastroenterology

## 2022-10-20 ENCOUNTER — Other Ambulatory Visit (HOSPITAL_COMMUNITY): Payer: Self-pay

## 2022-10-20 ENCOUNTER — Ambulatory Visit (AMBULATORY_SURGERY_CENTER): Payer: Medicare Other

## 2022-10-20 VITALS — Ht 63.0 in | Wt 165.0 lb

## 2022-10-20 DIAGNOSIS — Z86010 Personal history of colonic polyps: Secondary | ICD-10-CM

## 2022-10-20 MED ORDER — NA SULFATE-K SULFATE-MG SULF 17.5-3.13-1.6 GM/177ML PO SOLN
1.0000 | Freq: Once | ORAL | 0 refills | Status: AC
Start: 2022-10-20 — End: 2022-10-22
  Filled 2022-10-20: qty 354, 1d supply, fill #0

## 2022-10-20 NOTE — Progress Notes (Signed)

## 2022-10-22 ENCOUNTER — Other Ambulatory Visit: Payer: Self-pay | Admitting: Ophthalmology

## 2022-10-22 DIAGNOSIS — C719 Malignant neoplasm of brain, unspecified: Secondary | ICD-10-CM

## 2022-10-28 ENCOUNTER — Other Ambulatory Visit (HOSPITAL_COMMUNITY): Payer: Self-pay

## 2022-11-03 ENCOUNTER — Other Ambulatory Visit: Payer: Self-pay

## 2022-11-03 ENCOUNTER — Other Ambulatory Visit (HOSPITAL_COMMUNITY): Payer: Self-pay

## 2022-11-08 ENCOUNTER — Other Ambulatory Visit (HOSPITAL_COMMUNITY): Payer: Self-pay

## 2022-11-10 ENCOUNTER — Other Ambulatory Visit (HOSPITAL_COMMUNITY): Payer: Self-pay

## 2022-11-10 MED ORDER — OXYBUTYNIN CHLORIDE ER 10 MG PO TB24: 10.0000 mg | ORAL_TABLET | Freq: Every day | ORAL | 0 refills | Status: AC

## 2022-11-16 ENCOUNTER — Encounter: Payer: Commercial Managed Care - PPO | Admitting: Gastroenterology

## 2022-11-25 ENCOUNTER — Encounter: Payer: Self-pay | Admitting: Gastroenterology

## 2022-12-07 ENCOUNTER — Telehealth: Payer: Self-pay | Admitting: Gastroenterology

## 2022-12-07 ENCOUNTER — Other Ambulatory Visit (HOSPITAL_COMMUNITY): Payer: Self-pay

## 2022-12-07 MED ORDER — ONDANSETRON HCL 4 MG PO TABS
4.0000 mg | ORAL_TABLET | Freq: Three times a day (TID) | ORAL | 0 refills | Status: AC | PRN
  Filled 2022-12-07: qty 4, 2d supply, fill #0

## 2022-12-07 NOTE — Telephone Encounter (Signed)
Returned the patient's phone call. She reports that she got sick and vomited after taking the Miralax split prep last night. She will start the Suprep this pm and again the am.. will send a Rx for Zofran 4 mg PO for her take prior to both doses of Suprep.

## 2022-12-07 NOTE — Telephone Encounter (Signed)
Inbound call from patient stating she has a procedure scheduled for tomorrow 6/19. States her prep medication has made her very sick. States she has been vomiting due to prep medication. Requesting a call back to discuss if she should proceed with prep medication and colonoscopy. Please advise, thank you.

## 2022-12-08 ENCOUNTER — Encounter: Payer: Self-pay | Admitting: Gastroenterology

## 2022-12-08 ENCOUNTER — Ambulatory Visit (AMBULATORY_SURGERY_CENTER): Payer: Medicare Other | Admitting: Gastroenterology

## 2022-12-08 VITALS — BP 123/60 | HR 61 | Temp 99.3°F | Resp 13 | Ht 63.0 in | Wt 165.0 lb

## 2022-12-08 DIAGNOSIS — Z09 Encounter for follow-up examination after completed treatment for conditions other than malignant neoplasm: Secondary | ICD-10-CM

## 2022-12-08 DIAGNOSIS — D122 Benign neoplasm of ascending colon: Secondary | ICD-10-CM | POA: Diagnosis not present

## 2022-12-08 DIAGNOSIS — Z86010 Personal history of colonic polyps: Secondary | ICD-10-CM | POA: Diagnosis not present

## 2022-12-08 DIAGNOSIS — K5730 Diverticulosis of large intestine without perforation or abscess without bleeding: Secondary | ICD-10-CM

## 2022-12-08 DIAGNOSIS — D123 Benign neoplasm of transverse colon: Secondary | ICD-10-CM | POA: Diagnosis not present

## 2022-12-08 DIAGNOSIS — K641 Second degree hemorrhoids: Secondary | ICD-10-CM

## 2022-12-08 DIAGNOSIS — D124 Benign neoplasm of descending colon: Secondary | ICD-10-CM | POA: Diagnosis not present

## 2022-12-08 MED ORDER — SODIUM CHLORIDE 0.9 % IV SOLN
500.0000 mL | INTRAVENOUS | Status: DC
Start: 2022-12-08 — End: 2022-12-08

## 2022-12-08 NOTE — Op Note (Signed)
[facility] Patient Name: Heidi Christensen Procedure Date: 12/08/2022 9:40 AM MRN: [id number] Endoscopist: Doristine Locks , MD, [id number] Age: 67 Referring MD:  Date of Birth: [birth year] Gender: Female Account #: [id number] Procedure:                Colonoscopy Indications:              Surveillance: Personal history of adenomatous                            polyps on last colonoscopy 3 years ago                           Last colonoscopy was 12/2018 and notable for 3                            subcentimeter polyps (adenoma x2, SSP x1), sigmoid                            diverticulosis, internal hemorrhoids. There was                            also a 5 mm submucosal nodule in the transverse                            colon, but pathology was negative/benign. She                            returns today for ongoing surveillance. Medicines:                Monitored Anesthesia Care Procedure:                Pre-Anesthesia Assessment:                           - Prior to the procedure, a History and Physical                            was performed, and patient medications and                            allergies were reviewed. The patient's tolerance of                            previous anesthesia was also reviewed. The risks                            and benefits of the procedure and the sedation                            options and risks were discussed with the patient.                            All questions were answered, and informed consent  was obtained. Prior Anticoagulants: The patient has                            taken no anticoagulant or antiplatelet agents. ASA                            Grade Assessment: II - A patient with mild systemic                            disease. After reviewing the risks and benefits,                            the patient was deemed in satisfactory condition to                            undergo the  procedure.                           After obtaining informed consent, the colonoscope                            was passed under direct vision. Throughout the                            procedure, the patient's blood pressure, pulse, and                            oxygen saturations were monitored continuously. The                            Olympus CF-HQ190L SN [id number] was introduced through                            the anus and advanced to the the cecum, identified                            by appendiceal orifice and ileocecal valve. The                            colonoscopy was performed without difficulty. The                            patient tolerated the procedure well. The quality                            of the bowel preparation was good. The ileocecal                            valve, appendiceal orifice, and rectum were                            photographed. Scope In: 10:24:25 AM Scope Out: 10:45:07 AM Scope Withdrawal Time: 0 hours 16 minutes 44 seconds  Total Procedure Duration: 0 hours  20 minutes 42 seconds  Findings:                 The perianal and digital rectal examinations were                            normal.                           Three sessile polyps were found in the descending                            colon, transverse colon, and ascending colon. The                            polyps were 4 to 8 mm in size. These polyps were                            removed with a cold snare. Resection and retrieval                            were complete. Estimated blood loss was minimal.                           A tattoo was seen in the transverse colon. The                            transvese colon polyp above was located immediately                            adjacent to the tattoo. Otherwise, the previously                            noted (and biopsied) submucosal nodule was no                            longer present. made several passes through this                             area, and the mucosa was otherwise all normal.                           Multiple large-mouthed and small-mouthed                            diverticula were found in the sigmoid colon.                           Non-bleeding internal hemorrhoids were found during                            retroflexion. The hemorrhoids were small.                           There was a small lipoma, in the  cecum. Complications:            No immediate complications. Estimated Blood Loss:     Estimated blood loss was minimal. Impression:               - Three 4 to 8 mm polyps in the descending colon,                            in the transverse colon and in the ascending colon,                            removed with a cold snare. Resected and retrieved.                           - A tattoo was seen in the transverse colon. The                            tattoo site appeared normal.                           - Diverticulosis in the sigmoid colon.                           - Non-bleeding internal hemorrhoids.                           - Small lipoma in the cecum. Recommendation:           - Patient has a contact number available for                            emergencies. The signs and symptoms of potential                            delayed complications were discussed with the                            patient. Return to normal activities tomorrow.                            Written discharge instructions were provided to the                            patient.                           - Resume previous diet.                           - Continue present medications.                           - Await pathology results.                           - Repeat colonoscopy for surveillance based on  pathology results.                           - Return to GI office PRN. Doristine Locks, MD 12/08/2022 10:54:56 AM

## 2022-12-08 NOTE — Progress Notes (Signed)
Called to room to assist during endoscopic procedure.  Patient ID and intended procedure confirmed with present staff. Received instructions for my participation in the procedure from the performing physician.  

## 2022-12-08 NOTE — Progress Notes (Signed)
GASTROENTEROLOGY PROCEDURE H&P NOTE   Primary Care Physician: Sharmon Revere, MD    Reason for Procedure:  Colon polyp surveillance  Plan:    Colonoscopy  Patient is appropriate for endoscopic procedure(s) in the ambulatory ([facility]) setting.  The nature of the procedure, as well as the risks, benefits, and alternatives were carefully and thoroughly reviewed with the patient. Ample time for discussion and questions allowed. The patient understood, was satisfied, and agreed to proceed.     HPI: Heidi Christensen is a 67 y.o. female who presents for colonoscopy for ongoing colon polyp surveillance.    Last colonoscopy was 12/2018 and notable for 3 subcentimeter polyps (adenoma x2, SSP x1), sigmoid diverticulosis, internal hemorrhoids.  There wass also a 5 mm submucosal nodule in the transverse colon, but pathology was negative/benign.  Returns today for ongoing surveillance.     No known family history of colon cancer or related malignancy.  Patient is otherwise without complaints or active issues today.  Past Medical History:  Diagnosis Date   Allergy    Arthritis    Asthma    Cancer (HCC)    Glioblastoma at 67 years old   Depression    Hyperlipidemia    Hypertension    Thyroid disease    hypothyroidism    Past Surgical History:  Procedure Laterality Date   BRAIN SURGERY  67 yo   CHOLECYSTECTOMY  1998   EXPLORATORY LAPAROTOMY  1983    Prior to Admission medications   Medication Sig Start Date End Date Taking? Authorizing Provider  acetaminophen (TYLENOL) 500 MG tablet Take 500 mg by mouth every 6 (six) hours as needed.    [provider]  albuterol (VENTOLIN HFA) 108 (90 Base) MCG/ACT inhaler Inhale 2 puffs into the lungs every 4 (four) hours as needed for wheezing (cough, shortness of breath or wheezing.). Patient not taking: Reported on 10/20/2022 11/06/19   Waldon Merl, PA-C  b complex vitamins tablet Take 1 tablet by mouth daily.    [provider]  escitalopram (LEXAPRO) 10 MG tablet TAKE 1 TABLET BY MOUTH DAILY. 04/15/20 04/15/21  Waldon Merl, PA-C  escitalopram (LEXAPRO) 10 MG tablet Take 1 tablet by mouth once a day 01/14/22     escitalopram (LEXAPRO) 10 MG tablet Take 1 tablet (10 mg total) by mouth daily. 07/22/22     levothyroxine (SYNTHROID) 100 MCG tablet Take 1 tablet by mouth once a day before breakfast 01/25/22     levothyroxine (SYNTHROID) 100 MCG tablet Take 1 tablet (100 mcg total) by mouth daily. 08/06/22     lisinopril (ZESTRIL) 10 MG tablet TAKE 1 TABLET (10 MG TOTAL) BY MOUTH DAILY. 07/10/20 07/10/21  Waldon Merl, PA-C  lisinopril (ZESTRIL) 10 MG tablet Take 1 tablet by mouth once a day 11/03/20     lisinopril (ZESTRIL) 10 MG tablet Take 0.5 tablets (5 mg total) by mouth daily. 05/11/22     lisinopril (ZESTRIL) 10 MG tablet Take 0.5 tablets (5 mg total) by mouth daily. 08/06/22     nirmatrelvir & ritonavir (PAXLOVID, 300/100,) 20 x 150 MG & 10 x 100MG  TBPK Take 3 tablets by mouth 2 (two) times daily. 05/07/22     ondansetron (ZOFRAN) 4 MG tablet Take 1 tablet (4 mg total) by mouth every 8 (eight) hours as needed for nausea or vomiting. 12/07/22   Mclane Arora V, DO  oxybutynin (DITROPAN-XL) 10 MG 24 hr tablet Take 1 tablet by mouth daily. 10/08/21  oxybutynin (DITROPAN-XL) 10 MG 24 hr tablet Take 1 tablet (10 mg) by mouth daily. 08/06/22     oxybutynin (DITROPAN-XL) 10 MG 24 hr tablet Take 1 tablet (10 mg total) by mouth daily. 11/10/22     simvastatin (ZOCOR) 20 MG tablet Take 1 tablet by mouth once a day at bedtime 10/29/20     simvastatin (ZOCOR) 20 MG tablet Take 1/2 tablet by mouth once a day at bedtime 11/02/21     simvastatin (ZOCOR) 20 MG tablet Take 1 tablet (20 mg total) by mouth daily in the evening. 08/06/22     SYNTHROID 100 MCG tablet TAKE 1 TABLET BY MOUTH DAILY BEFORE BREAKFAST. 07/28/20 07/28/21  Sheliah Hatch, MD  Vitamin D, Ergocalciferol, (DRISDOL) 1.25 MG (50000 UNIT) CAPS capsule  Take 1 capsule by mouth once a week Patient not taking: Reported on 10/20/2022 08/11/21       Current Outpatient Medications  Medication Sig Dispense Refill   acetaminophen (TYLENOL) 500 MG tablet Take 500 mg by mouth every 6 (six) hours as needed.     albuterol (VENTOLIN HFA) 108 (90 Base) MCG/ACT inhaler Inhale 2 puffs into the lungs every 4 (four) hours as needed for wheezing (cough, shortness of breath or wheezing.). (Patient not taking: Reported on 10/20/2022) 18 g 3   b complex vitamins tablet Take 1 tablet by mouth daily.     escitalopram (LEXAPRO) 10 MG tablet TAKE 1 TABLET BY MOUTH DAILY. 90 tablet 1   escitalopram (LEXAPRO) 10 MG tablet Take 1 tablet by mouth once a day 90 tablet 4   escitalopram (LEXAPRO) 10 MG tablet Take 1 tablet (10 mg total) by mouth daily. 90 tablet 3   levothyroxine (SYNTHROID) 100 MCG tablet Take 1 tablet by mouth once a day before breakfast 90 tablet 4   levothyroxine (SYNTHROID) 100 MCG tablet Take 1 tablet (100 mcg total) by mouth daily. 90 tablet 3   lisinopril (ZESTRIL) 10 MG tablet TAKE 1 TABLET (10 MG TOTAL) BY MOUTH DAILY. 30 tablet 1   lisinopril (ZESTRIL) 10 MG tablet Take 1 tablet by mouth once a day 90 tablet 4   lisinopril (ZESTRIL) 10 MG tablet Take 0.5 tablets (5 mg total) by mouth daily. 90 tablet 4   lisinopril (ZESTRIL) 10 MG tablet Take 0.5 tablets (5 mg total) by mouth daily. 45 tablet 3   nirmatrelvir & ritonavir (PAXLOVID, 300/100,) 20 x 150 MG & 10 x 100MG  TBPK Take 3 tablets by mouth 2 (two) times daily. 30 tablet 0   ondansetron (ZOFRAN) 4 MG tablet Take 1 tablet (4 mg total) by mouth every 8 (eight) hours as needed for nausea or vomiting. 4 tablet 0   oxybutynin (DITROPAN-XL) 10 MG 24 hr tablet Take 1 tablet by mouth daily. 90 tablet 3   oxybutynin (DITROPAN-XL) 10 MG 24 hr tablet Take 1 tablet (10 mg) by mouth daily. 90 tablet 3   oxybutynin (DITROPAN-XL) 10 MG 24 hr tablet Take 1 tablet (10 mg total) by mouth daily. 90 tablet 0    simvastatin (ZOCOR) 20 MG tablet Take 1 tablet by mouth once a day at bedtime 90 tablet 4   simvastatin (ZOCOR) 20 MG tablet Take 1/2 tablet by mouth once a day at bedtime 45 tablet 4   simvastatin (ZOCOR) 20 MG tablet Take 1 tablet (20 mg total) by mouth daily in the evening. 90 tablet 3   SYNTHROID 100 MCG tablet TAKE 1 TABLET BY MOUTH DAILY BEFORE BREAKFAST. 90 tablet 1  Vitamin D, Ergocalciferol, (DRISDOL) 1.25 MG (50000 UNIT) CAPS capsule Take 1 capsule by mouth once a week (Patient not taking: Reported on 10/20/2022) 13 capsule 0   Current Facility-Administered Medications  Medication Dose Route Frequency Provider Last Rate Last Admin   0.9 %  sodium chloride infusion  500 mL Intravenous Continuous Latrenda Irani V, DO        Allergies as of 12/08/2022   (No Known Allergies)    Family History  Problem Relation Age of Onset   COPD Mother    Hypertension Father    Dementia Father    COPD Brother    Colon cancer Neg Hx    Esophageal cancer Neg Hx    Rectal cancer Neg Hx    Stomach cancer Neg Hx    Colon polyps Neg Hx     Social History   Socioeconomic History   Marital status: Married    Spouse name: Not on file   Number of children: 3   Years of education: Not on file   Highest education level: Not on file  Occupational History   Occupation: front desk    Comment: [employer]  Tobacco Use   Smoking status: Former    Packs/day: 1.00    Years: 10.00    Additional pack years: 0.00    Total pack years: 10.00    Types: Cigarettes    Quit date: 11/22/1981    Years since quitting: 41.0   Smokeless tobacco: Never  Vaping Use   Vaping Use: Never used  Substance and Sexual Activity   Alcohol use: No   Drug use: No   Sexual activity: Yes  Other Topics Concern   Not on file  Social History Narrative   Not on file   Social Determinants of Health   Financial Resource Strain: Not on file  Food Insecurity: Not on file  Transportation Needs:  Not on file  Physical Activity: Not on file  Stress: Not on file  Social Connections: Not on file  Intimate Partner Violence: Not on file    Physical Exam: Vital signs in last 24 hours: @BP  124/76   Pulse 71   Temp 99.3 F (37.4 C)   Ht 5\' 3"  (1.6 m)   Wt 165 lb (74.8 kg)   SpO2 96%   BMI 29.23 kg/m  GEN: NAD EYE: Sclerae anicteric ENT: MMM CV: Non-tachycardic Pulm: CTA b/l GI: Soft, NT/ND NEURO:  Alert & Oriented x 3   Doristine Locks, DO [facility]   12/08/2022 10:15 AM

## 2022-12-08 NOTE — Progress Notes (Signed)
Patient reports no health or mediation changes since pre visit.

## 2022-12-08 NOTE — Patient Instructions (Signed)
Please read handouts provided. Continue present medications. Await pathology results. Return to GI office as needed.   YOU HAD AN ENDOSCOPIC PROCEDURE TODAY AT THE [facility]:   Refer to the procedure report that was given to you for any specific questions about what was found during the examination.  If the procedure report does not answer your questions, please call your gastroenterologist to clarify.  If you requested that your care partner not be given the details of your procedure findings, then the procedure report has been included in a sealed envelope for you to review at your convenience later.  YOU SHOULD EXPECT: Some feelings of bloating in the abdomen. Passage of more gas than usual.  Walking can help get rid of the air that was put into your GI tract during the procedure and reduce the bloating. If you had a lower endoscopy (such as a colonoscopy or flexible sigmoidoscopy) you may notice spotting of blood in your stool or on the toilet paper. If you underwent a bowel prep for your procedure, you may not have a normal bowel movement for a few days.  Please Note:  You might notice some irritation and congestion in your nose or some drainage.  This is from the oxygen used during your procedure.  There is no need for concern and it should clear up in a day or so.  SYMPTOMS TO REPORT IMMEDIATELY:  Following lower endoscopy (colonoscopy or flexible sigmoidoscopy):  Excessive amounts of blood in the stool  Significant tenderness or worsening of abdominal pains  Swelling of the abdomen that is new, acute  Fever of 100F or higher   For urgent or emergent issues, a gastroenterologist can be reached at any hour by calling [phone]. Do not use MyChart messaging for urgent concerns.    DIET:  We do recommend a small meal at first, but then you may proceed to your regular diet.  Drink plenty of fluids but you should avoid alcoholic beverages for 24 hours.  ACTIVITY:   You should plan to take it easy for the rest of today and you should NOT DRIVE or use heavy machinery until tomorrow (because of the sedation medicines used during the test).    FOLLOW UP: Our staff will call the number listed on your records the next business day following your procedure.  We will call around 7:15- 8:00 am to check on you and address any questions or concerns that you may have regarding the information given to you following your procedure. If we do not reach you, we will leave a message.     If any biopsies were taken you will be contacted by phone or by letter within the next 1-3 weeks.  Please call us at [phone] if you have not heard about the biopsies in 3 weeks.    SIGNATURES/CONFIDENTIALITY: You and/or your care partner have signed paperwork which will be entered into your electronic medical record.  These signatures attest to the fact that that the information above on your After Visit Summary has been reviewed and is understood.  Full responsibility of the confidentiality of this discharge information lies with you and/or your care-partner. 

## 2022-12-08 NOTE — Progress Notes (Signed)
To pacu, VSS. Report to [email or web address] 

## 2022-12-09 ENCOUNTER — Telehealth: Payer: Self-pay

## 2022-12-09 NOTE — Telephone Encounter (Signed)
  Follow up Call-     12/08/2022    9:58 AM  Call back number  Post procedure Call Back phone  # [phone]  Permission to leave phone message Yes     Patient questions:  Do you have a fever, pain , or abdominal swelling? No. Pain Score  0 *  Have you tolerated food without any problems? Yes.    Have you been able to return to your normal activities? Yes.    Do you have any questions about your discharge instructions: Diet   No. Medications  No. Follow up visit  No.  Do you have questions or concerns about your Care? No.  Actions: * If pain score is 4 or above: No action needed, pain <4. Per patient spouse

## 2022-12-10 ENCOUNTER — Ambulatory Visit
Admission: RE | Admit: 2022-12-10 | Discharge: 2022-12-10 | Disposition: A | Discharge status: Home / Self Care | Payer: Medicare Other | Source: Ambulatory Visit | Attending: Ophthalmology | Admitting: Ophthalmology

## 2022-12-10 DIAGNOSIS — C719 Malignant neoplasm of brain, unspecified: Secondary | ICD-10-CM

## 2022-12-10 MED ORDER — GADOPICLENOL 0.5 MMOL/ML IV SOLN
7.5000 mL | Freq: Once | INTRAVENOUS | Status: AC | PRN
  Administered 2022-12-10: 7.5 mL via INTRAVENOUS

## 2022-12-13 ENCOUNTER — Other Ambulatory Visit (HOSPITAL_COMMUNITY): Payer: Self-pay

## 2023-01-21 ENCOUNTER — Other Ambulatory Visit (HOSPITAL_COMMUNITY): Payer: Self-pay

## 2023-01-31 ENCOUNTER — Other Ambulatory Visit (HOSPITAL_COMMUNITY): Payer: Self-pay

## 2023-01-31 MED ORDER — ALBUTEROL SULFATE HFA 108 (90 BASE) MCG/ACT IN AERS
2.0000 | INHALATION_SPRAY | RESPIRATORY_TRACT | 3 refills | Status: AC | PRN
  Filled 2023-01-31: qty 20.1, 75d supply, fill #0
  Filled 2023-02-01: qty 6.7, 30d supply, fill #0

## 2023-01-31 MED ORDER — ESCITALOPRAM OXALATE 20 MG PO TABS
20.0000 mg | ORAL_TABLET | Freq: Every day | ORAL | 3 refills | Status: AC
  Filled 2023-01-31: qty 90, 90d supply, fill #0
  Filled 2023-06-18 (×2): qty 90, 90d supply, fill #1
  Filled 2023-09-12: qty 90, 90d supply, fill #2
  Filled 2023-12-14: qty 90, 90d supply, fill #3

## 2023-02-01 ENCOUNTER — Other Ambulatory Visit (HOSPITAL_COMMUNITY): Payer: Self-pay

## 2023-02-07 ENCOUNTER — Other Ambulatory Visit (HOSPITAL_COMMUNITY): Payer: Self-pay

## 2023-02-07 ENCOUNTER — Other Ambulatory Visit: Payer: Self-pay

## 2023-02-11 ENCOUNTER — Other Ambulatory Visit (HOSPITAL_COMMUNITY): Payer: Self-pay

## 2023-03-25 ENCOUNTER — Other Ambulatory Visit (HOSPITAL_COMMUNITY): Payer: Self-pay

## 2023-05-09 ENCOUNTER — Other Ambulatory Visit (HOSPITAL_COMMUNITY): Payer: Self-pay

## 2023-05-10 ENCOUNTER — Other Ambulatory Visit: Payer: Self-pay

## 2023-05-12 ENCOUNTER — Other Ambulatory Visit (HOSPITAL_COMMUNITY): Payer: Self-pay

## 2023-05-13 ENCOUNTER — Other Ambulatory Visit (HOSPITAL_COMMUNITY): Payer: Self-pay

## 2023-06-01 ENCOUNTER — Other Ambulatory Visit (HOSPITAL_COMMUNITY): Payer: Self-pay

## 2023-06-02 ENCOUNTER — Other Ambulatory Visit (HOSPITAL_COMMUNITY): Payer: Self-pay

## 2023-06-18 ENCOUNTER — Other Ambulatory Visit (HOSPITAL_COMMUNITY): Payer: Self-pay

## 2023-06-20 ENCOUNTER — Other Ambulatory Visit (HOSPITAL_COMMUNITY): Payer: Self-pay

## 2023-08-08 ENCOUNTER — Other Ambulatory Visit (HOSPITAL_COMMUNITY): Payer: Self-pay

## 2023-08-09 ENCOUNTER — Other Ambulatory Visit (HOSPITAL_COMMUNITY): Payer: Self-pay

## 2023-08-09 ENCOUNTER — Other Ambulatory Visit: Payer: Self-pay

## 2023-08-09 ENCOUNTER — Other Ambulatory Visit (HOSPITAL_BASED_OUTPATIENT_CLINIC_OR_DEPARTMENT_OTHER): Payer: Self-pay

## 2023-08-09 MED ORDER — SIMVASTATIN 20 MG PO TABS
20.0000 mg | ORAL_TABLET | Freq: Every day | ORAL | 3 refills | Status: AC
  Filled 2023-08-09: qty 90, 90d supply, fill #0
  Filled 2023-11-08: qty 90, 90d supply, fill #1
  Filled 2024-02-12: qty 90, 90d supply, fill #2

## 2023-08-09 MED ORDER — OXYBUTYNIN CHLORIDE ER 10 MG PO TB24
10.0000 mg | ORAL_TABLET | Freq: Every day | ORAL | 3 refills | Status: AC
  Filled 2023-08-09: qty 90, 90d supply, fill #0
  Filled 2023-11-08: qty 90, 90d supply, fill #1
  Filled 2024-02-08: qty 90, 90d supply, fill #2

## 2023-08-09 MED ORDER — LISINOPRIL 10 MG PO TABS
5.0000 mg | ORAL_TABLET | Freq: Every day | ORAL | 3 refills | Status: AC
  Filled 2023-08-09 – 2023-09-12 (×2): qty 45, 90d supply, fill #0
  Filled 2023-12-19: qty 45, 90d supply, fill #1

## 2023-09-05 ENCOUNTER — Other Ambulatory Visit (HOSPITAL_COMMUNITY): Payer: Self-pay

## 2023-09-05 MED ORDER — LEVOTHYROXINE SODIUM 100 MCG PO TABS
100.0000 ug | ORAL_TABLET | Freq: Every day | ORAL | 3 refills | Status: AC
  Filled 2023-09-05: qty 90, 90d supply, fill #0
  Filled 2023-11-29: qty 90, 90d supply, fill #1

## 2023-09-12 ENCOUNTER — Other Ambulatory Visit (HOSPITAL_COMMUNITY): Payer: Self-pay

## 2023-10-27 ENCOUNTER — Other Ambulatory Visit (HOSPITAL_COMMUNITY): Payer: Self-pay

## 2023-10-27 MED ORDER — SIMVASTATIN 20 MG PO TABS
20.0000 mg | ORAL_TABLET | Freq: Every evening | ORAL | 4 refills | Status: AC
  Filled 2023-10-27: qty 90, 90d supply, fill #0

## 2023-11-07 ENCOUNTER — Other Ambulatory Visit (HOSPITAL_COMMUNITY): Payer: Self-pay

## 2023-11-15 ENCOUNTER — Other Ambulatory Visit (HOSPITAL_COMMUNITY): Payer: Self-pay

## 2023-11-15 ENCOUNTER — Other Ambulatory Visit: Payer: Self-pay | Admitting: Family Medicine

## 2023-11-15 ENCOUNTER — Ambulatory Visit
Admission: RE | Admit: 2023-11-15 | Discharge: 2023-11-15 | Disposition: A | Discharge status: Home / Self Care | Source: Ambulatory Visit | Attending: Family Medicine

## 2023-11-15 DIAGNOSIS — M79671 Pain in right foot: Secondary | ICD-10-CM

## 2023-11-15 MED ORDER — TRAMADOL HCL 50 MG PO TABS
50.0000 mg | ORAL_TABLET | Freq: Three times a day (TID) | ORAL | 0 refills | Status: AC | PRN
  Filled 2023-11-15: qty 30, 10d supply, fill #0

## 2023-11-29 ENCOUNTER — Other Ambulatory Visit: Payer: Self-pay

## 2023-12-16 ENCOUNTER — Other Ambulatory Visit (HOSPITAL_COMMUNITY): Payer: Self-pay

## 2024-07-27 ENCOUNTER — Other Ambulatory Visit (HOSPITAL_COMMUNITY): Payer: Self-pay

## 2024-07-27 MED ORDER — NEOMYCIN-POLYMYXIN-DEXAMETH 3.5-10000-0.1 OP OINT
TOPICAL_OINTMENT | Freq: Two times a day (BID) | OPHTHALMIC | 0 refills | Status: AC
  Filled 2024-07-27: qty 3.5, 5d supply, fill #0
